# Patient Record
Sex: Female | Born: 1985
Health system: Southern US, Community
[De-identification: ages and names within clinical notes are randomized; demographics above are authoritative.]

## PROBLEM LIST (undated history)

## (undated) DIAGNOSIS — K219 Gastro-esophageal reflux disease without esophagitis: Secondary | ICD-10-CM

## (undated) DIAGNOSIS — F419 Anxiety disorder, unspecified: Secondary | ICD-10-CM

## (undated) DIAGNOSIS — T7840XA Allergy, unspecified, initial encounter: Secondary | ICD-10-CM

## (undated) DIAGNOSIS — Z9889 Other specified postprocedural states: Secondary | ICD-10-CM

## (undated) DIAGNOSIS — R112 Nausea with vomiting, unspecified: Secondary | ICD-10-CM

## (undated) HISTORY — DX: Anxiety disorder, unspecified: F41.9

## (undated) HISTORY — PX: ABDOMINAL SURGERY: SHX537

## (undated) HISTORY — DX: Allergy, unspecified, initial encounter: T78.40XA

## (undated) HISTORY — PX: WISDOM TOOTH EXTRACTION: SHX21

## (undated) HISTORY — PX: COSMETIC SURGERY: SHX468

## (undated) HISTORY — PX: ESOPHAGOSCOPY W/ BOTOX INJECTION: SHX1533

## (undated) HISTORY — DX: Gastro-esophageal reflux disease without esophagitis: K21.9

---

## 2017-06-18 LAB — HM HIV SCREENING LAB: HM HIV Screening: NEGATIVE

## 2017-06-18 LAB — HEPATITIS B SURFACE ANTIGEN: Hepatitis B Surface Ag: NEGATIVE

## 2017-06-18 LAB — HM HEPATITIS C SCREENING LAB: HM Hepatitis Screen: NEGATIVE

## 2018-02-18 LAB — HM PAP SMEAR: HM Pap smear: NEGATIVE

## 2019-03-30 DIAGNOSIS — F419 Anxiety disorder, unspecified: Secondary | ICD-10-CM | POA: Diagnosis not present

## 2019-03-30 DIAGNOSIS — F329 Major depressive disorder, single episode, unspecified: Secondary | ICD-10-CM | POA: Diagnosis not present

## 2019-04-07 DIAGNOSIS — F329 Major depressive disorder, single episode, unspecified: Secondary | ICD-10-CM | POA: Diagnosis not present

## 2019-04-07 DIAGNOSIS — F419 Anxiety disorder, unspecified: Secondary | ICD-10-CM | POA: Diagnosis not present

## 2019-04-22 DIAGNOSIS — F329 Major depressive disorder, single episode, unspecified: Secondary | ICD-10-CM | POA: Diagnosis not present

## 2019-04-22 DIAGNOSIS — F419 Anxiety disorder, unspecified: Secondary | ICD-10-CM | POA: Diagnosis not present

## 2019-05-05 DIAGNOSIS — F329 Major depressive disorder, single episode, unspecified: Secondary | ICD-10-CM | POA: Diagnosis not present

## 2019-05-05 DIAGNOSIS — F419 Anxiety disorder, unspecified: Secondary | ICD-10-CM | POA: Diagnosis not present

## 2019-05-05 MED FILL — VIIBRYD 20 MG TABLET: 20 | 90 days supply | Qty: 90 | Fill #0

## 2019-05-10 DIAGNOSIS — F329 Major depressive disorder, single episode, unspecified: Secondary | ICD-10-CM | POA: Diagnosis not present

## 2019-05-10 DIAGNOSIS — F419 Anxiety disorder, unspecified: Secondary | ICD-10-CM | POA: Diagnosis not present

## 2019-06-09 DIAGNOSIS — F329 Major depressive disorder, single episode, unspecified: Secondary | ICD-10-CM | POA: Diagnosis not present

## 2019-06-09 DIAGNOSIS — F419 Anxiety disorder, unspecified: Secondary | ICD-10-CM | POA: Diagnosis not present

## 2019-06-09 MED FILL — FLUoxetine HCL 20 MG CAPS: 20 | 30 days supply | Qty: 60 | Fill #0

## 2019-06-17 DIAGNOSIS — F329 Major depressive disorder, single episode, unspecified: Secondary | ICD-10-CM | POA: Diagnosis not present

## 2019-06-17 DIAGNOSIS — F419 Anxiety disorder, unspecified: Secondary | ICD-10-CM | POA: Diagnosis not present

## 2019-06-30 DIAGNOSIS — H5213 Myopia, bilateral: Secondary | ICD-10-CM | POA: Diagnosis not present

## 2019-06-30 DIAGNOSIS — H52223 Regular astigmatism, bilateral: Secondary | ICD-10-CM | POA: Diagnosis not present

## 2019-07-05 DIAGNOSIS — F329 Major depressive disorder, single episode, unspecified: Secondary | ICD-10-CM | POA: Diagnosis not present

## 2019-07-05 DIAGNOSIS — F419 Anxiety disorder, unspecified: Secondary | ICD-10-CM | POA: Diagnosis not present

## 2019-07-05 MED FILL — FLUoxetine HCL 40 MG CAPS: 40 | 90 days supply | Qty: 90 | Fill #0

## 2019-07-21 MED FILL — FLUoxetine HCL 40 MG CAPS: 40 | 90 days supply | Qty: 90 | Fill #0

## 2019-08-02 DIAGNOSIS — F329 Major depressive disorder, single episode, unspecified: Secondary | ICD-10-CM | POA: Diagnosis not present

## 2019-08-02 DIAGNOSIS — F419 Anxiety disorder, unspecified: Secondary | ICD-10-CM | POA: Diagnosis not present

## 2019-08-16 DIAGNOSIS — F419 Anxiety disorder, unspecified: Secondary | ICD-10-CM | POA: Diagnosis not present

## 2019-08-16 DIAGNOSIS — F329 Major depressive disorder, single episode, unspecified: Secondary | ICD-10-CM | POA: Diagnosis not present

## 2019-08-30 DIAGNOSIS — F329 Major depressive disorder, single episode, unspecified: Secondary | ICD-10-CM | POA: Diagnosis not present

## 2019-08-30 DIAGNOSIS — F419 Anxiety disorder, unspecified: Secondary | ICD-10-CM | POA: Diagnosis not present

## 2019-09-28 DIAGNOSIS — F329 Major depressive disorder, single episode, unspecified: Secondary | ICD-10-CM | POA: Diagnosis not present

## 2019-09-28 DIAGNOSIS — F419 Anxiety disorder, unspecified: Secondary | ICD-10-CM | POA: Diagnosis not present

## 2019-10-13 MED FILL — FLUoxetine HCL 40 MG CAPS: 40 | 90 days supply | Qty: 90 | Fill #0

## 2019-12-16 DIAGNOSIS — F329 Major depressive disorder, single episode, unspecified: Secondary | ICD-10-CM | POA: Diagnosis not present

## 2019-12-16 DIAGNOSIS — F419 Anxiety disorder, unspecified: Secondary | ICD-10-CM | POA: Diagnosis not present

## 2019-12-21 DIAGNOSIS — F419 Anxiety disorder, unspecified: Secondary | ICD-10-CM | POA: Diagnosis not present

## 2019-12-21 DIAGNOSIS — F329 Major depressive disorder, single episode, unspecified: Secondary | ICD-10-CM | POA: Diagnosis not present

## 2020-02-04 ENCOUNTER — Other Ambulatory Visit: Payer: Self-pay

## 2020-02-04 ENCOUNTER — Encounter: Payer: Self-pay | Admitting: Physician Assistant

## 2020-02-04 ENCOUNTER — Other Ambulatory Visit: Payer: Self-pay | Admitting: Physician Assistant

## 2020-02-04 ENCOUNTER — Ambulatory Visit (INDEPENDENT_AMBULATORY_CARE_PROVIDER_SITE_OTHER): Payer: 59 | Admitting: Physician Assistant

## 2020-02-04 VITALS — BP 142/88 | HR 99 | Temp 98.2°F | Ht 66.0 in | Wt 188.6 lb

## 2020-02-04 DIAGNOSIS — E669 Obesity, unspecified: Secondary | ICD-10-CM

## 2020-02-04 DIAGNOSIS — R030 Elevated blood-pressure reading, without diagnosis of hypertension: Secondary | ICD-10-CM | POA: Diagnosis not present

## 2020-02-04 DIAGNOSIS — K219 Gastro-esophageal reflux disease without esophagitis: Secondary | ICD-10-CM | POA: Diagnosis not present

## 2020-02-04 DIAGNOSIS — Z136 Encounter for screening for cardiovascular disorders: Secondary | ICD-10-CM | POA: Diagnosis not present

## 2020-02-04 DIAGNOSIS — F419 Anxiety disorder, unspecified: Secondary | ICD-10-CM

## 2020-02-04 DIAGNOSIS — Z13220 Encounter for screening for lipoid disorders: Secondary | ICD-10-CM

## 2020-02-04 DIAGNOSIS — Z0000 Encounter for general adult medical examination without abnormal findings: Secondary | ICD-10-CM

## 2020-02-04 LAB — COMPREHENSIVE METABOLIC PANEL
ALT: 20 U/L (ref 0–35)
AST: 16 U/L (ref 0–37)
Albumin: 4.6 g/dL (ref 3.5–5.2)
Alkaline Phosphatase: 54 U/L (ref 39–117)
BUN: 11 mg/dL (ref 6–23)
CO2: 24 mEq/L (ref 19–32)
Calcium: 9.8 mg/dL (ref 8.4–10.5)
Chloride: 100 mEq/L (ref 96–112)
Creatinine, Ser: 0.79 mg/dL (ref 0.40–1.20)
GFR: 82.56 mL/min (ref >60.00)
Glucose, Bld: 88 mg/dL (ref 70–99)
Potassium: 4.7 mEq/L (ref 3.5–5.1)
Sodium: 136 mEq/L (ref 135–145)
Total Bilirubin: 0.4 mg/dL (ref 0.2–1.2)
Total Protein: 7.4 g/dL (ref 6.0–8.3)

## 2020-02-04 LAB — CBC WITH DIFFERENTIAL/PLATELET
Basophils Absolute: 0 10*3/uL (ref 0.0–0.1)
Basophils Relative: 0.5 % (ref 0.0–3.0)
Eosinophils Absolute: 0.1 10*3/uL (ref 0.0–0.7)
Eosinophils Relative: 1.4 % (ref 0.0–5.0)
HCT: 39.1 % (ref 36.0–46.0)
Hemoglobin: 12.9 g/dL (ref 12.0–15.0)
Lymphocytes Relative: 23.8 % (ref 12.0–46.0)
Lymphs Abs: 1.9 10*3/uL (ref 0.7–4.0)
MCHC: 33 g/dL (ref 30.0–36.0)
MCV: 87.5 fl (ref 78.0–100.0)
Monocytes Absolute: 0.4 10*3/uL (ref 0.1–1.0)
Monocytes Relative: 5.2 % (ref 3.0–12.0)
Neutro Abs: 5.6 10*3/uL (ref 1.4–7.7)
Neutrophils Relative %: 69.1 % (ref 43.0–77.0)
Platelets: 290 10*3/uL (ref 150.0–400.0)
RBC: 4.47 Mil/uL (ref 3.87–5.11)
RDW: 13.7 % (ref 11.5–15.5)
WBC: 8.2 10*3/uL (ref 4.0–10.5)

## 2020-02-04 LAB — LIPID PANEL
Cholesterol: 226 mg/dL — ABNORMAL HIGH (ref 0–200)
HDL: 58 mg/dL (ref >39.00)
NonHDL: 168.43
Total CHOL/HDL Ratio: 4
Triglycerides: 213 mg/dL — ABNORMAL HIGH (ref 0.0–149.0)
VLDL: 42.6 mg/dL — ABNORMAL HIGH (ref 0.0–40.0)

## 2020-02-04 LAB — LDL CHOLESTEROL, DIRECT: Direct LDL: 149 mg/dL

## 2020-02-04 MED ORDER — NORETHIN ACE-ETH ESTRAD-FE 1-20 MG-MCG PO TABS: 1.0000 | ORAL_TABLET | Freq: Every day | ORAL | 3 refills | Status: DC

## 2020-02-04 MED ORDER — OMEPRAZOLE 20 MG PO CPDR: 20.0000 mg | DELAYED_RELEASE_CAPSULE | Freq: Every day | ORAL | 1 refills | Status: DC

## 2020-02-04 MED ORDER — FLUOXETINE HCL 40 MG PO CAPS: 40.0000 mg | ORAL_CAPSULE | Freq: Every day | ORAL | 1 refills | Status: DC

## 2020-02-04 MED FILL — OMEPRAZOLE 20 MG CAP: 20 | 90 days supply | Qty: 90 | Fill #0

## 2020-02-04 NOTE — Progress Notes (Signed)
Subjective:    Jamie Holder is a 34 y.o. female and is here for a comprehensive physical exam.  HPI  Health Maintenance Due  Topic Date Due  . Hepatitis C Screening  Never done  . HIV Screening  Never done    Acute Concerns: None  Chronic Issues: Elevated blood pressure reading  -- Currently not on any medication. At home blood pressure readings are: not checked. She did have to monitor her blood pressure during her pregnancy but was never started on medication. Patient denies chest pain, SOB, blurred vision, dizziness, unusual headaches, lower leg swelling. Patient is compliant with medication. Denies excessive caffeine intake, stimulant usage, excessive alcohol intake, or increase in salt consumption.  BP Readings from Last 3 Encounters:  02/04/20 (!) 142/88   Anxiety -- long history of this since childhood. Started on Lexapro, felt good on this, but had issues with sexual dysfunction. Currently on Prozac 40 mg daily and doing well with this medication. Continues to see therapist q 6-8 weeks. Denies PPD or current issues with depression. GERD -- currently taking omeprazole 20 mg daily. Symptoms are overall well controlled when she takes this medication. Denies rectal bleeding, unintentional weight loss.  Health Maintenance: Immunizations -- UTD Colonoscopy -- n/a Mammogram -- n/a PAP -- UTD per patient -- requesting records Bone Density -- n/a Diet -- well balanced Caffeine intake -- 1-2 cups daily Sleep habits -- variable Exercise -- exercises 3 times a week -- does Barre classes Current Weight -- Weight: 188 lb 9.6 oz (85.5 kg) -- high for her Weight History: Wt Readings from Last 10 Encounters:  02/04/20 188 lb 9.6 oz (85.5 kg)   Body mass index is 30.44 kg/m. Mood -- anxiety overall controlled No LMP recorded. (Menstrual status: Oral contraceptives). Period characteristics -- n/a Birth control -- well controlled Alcohol -- 8 glasses per week  Depression  screen PHQ 2/9 02/04/2020  Decreased Interest 0  Down, Depressed, Hopeless 0  PHQ - 2 Score 0     Other providers/specialists: Patient Care Team: Jarold Motto, [place] as PCP - General (Physician Assistant) Rosemarie Beath, NP as Nurse Practitioner (Psychology) Melida Quitter, OD as Attending Physician (Optometry)   UTD with dentist  PMHx, SurgHx, SocialHx, Medications, and Allergies were reviewed in the Visit Navigator and updated as appropriate.   Past Medical History:  Diagnosis Date  . Anxiety     History reviewed. No pertinent surgical history.   Family History  Problem Relation Age of Onset  . Depression Mother   . Hearing loss Mother   . Hyperlipidemia Mother   . Hypertension Mother   . Hyperlipidemia Father   . Hypertension Father   . Depression Sister   . Heart attack Maternal Grandmother   . Heart attack Paternal Grandmother   . Breast cancer Neg Hx   . Colon cancer Neg Hx   . Osteoporosis Neg Hx     Social History   Tobacco Use  . Smoking status: Never Smoker  . Smokeless tobacco: Never Used  Substance Use Topics  . Alcohol use: Yes    Alcohol/week: 5.0 standard drinks    Types: 5 Glasses of wine per week  . Drug use: Never    Review of Systems:   Review of Systems  Constitutional: Negative for chills, fever, malaise/fatigue and weight loss.  HENT: Negative for hearing loss, sinus pain and sore throat.   Respiratory: Negative for cough and hemoptysis.   Cardiovascular: Negative for chest pain, palpitations, leg swelling  and PND.  Gastrointestinal: Negative for abdominal pain, constipation, diarrhea, heartburn, nausea and vomiting.  Genitourinary: Negative for dysuria, frequency and urgency.  Musculoskeletal: Negative for back pain, myalgias and neck pain.  Skin: Negative for itching and rash.  Neurological: Negative for dizziness, tingling, seizures and headaches.  Endo/Heme/Allergies: Negative for polydipsia.  Psychiatric/Behavioral:  Negative for depression. The patient is not nervous/anxious.     Objective:   BP (!) 142/88   Pulse 99   Temp 98.2 F (36.8 C) (Temporal)   Ht 5\' 6"  (1.676 m)   Wt 188 lb 9.6 oz (85.5 kg)   SpO2 99%   BMI 30.44 kg/m   General Appearance:    Alert, cooperative, no distress, appears stated age  Head:    Normocephalic, without obvious abnormality, atraumatic  Eyes:    PERRL, conjunctiva/corneas clear, EOM's intact, fundi    benign, both eyes  Ears:    Normal TM's and external ear canals, both ears  Nose:   Nares normal, septum midline, mucosa normal, no drainage    or sinus tenderness  Throat:   Lips, mucosa, and tongue normal; teeth and gums normal  Neck:   Supple, symmetrical, trachea midline, no adenopathy;    thyroid:  no enlargement/tenderness/nodules; no carotid   bruit or JVD  Back:     Symmetric, no curvature, ROM normal, no CVA tenderness  Lungs:     Clear to auscultation bilaterally, respirations unlabored  Chest Wall:    No tenderness or deformity   Heart:    Regular rate and rhythm, S1 and S2 normal, no murmur, rub   or gallop  Breast Exam:    Deferred  Abdomen:     Soft, non-tender, bowel sounds active all four quadrants,    no masses, no organomegaly  Genitalia:    Deferred  Rectal:    Deferred  Extremities:   Extremities normal, atraumatic, no cyanosis or edema  Pulses:   2+ and symmetric all extremities  Skin:   Skin color, texture, turgor normal, no rashes or lesions  Lymph nodes:   Cervical, supraclavicular, and axillary nodes normal  Neurologic:   CNII-XII intact, normal strength, sensation and reflexes    throughout   No results found for this or any previous visit.  Assessment/Plan:   Ron was seen today for new patient (initial visit).  Diagnoses and all orders for this visit:  Routine physical examination Today patient counseled on age appropriate routine health concerns for screening and prevention, each reviewed and up to date or declined.  Immunizations reviewed and up to date or declined. Labs ordered and reviewed. Risk factors for depression reviewed and negative. Hearing function and visual acuity are intact. ADLs screened and addressed as needed. Functional ability and level of safety reviewed and appropriate. Education, counseling and referrals performed based on assessed risks today. Patient provided with a copy of personalized plan for preventive services.  Encounter for lipid screening for cardiovascular disease -     Lipid panel  Anxiety Overall well controlled. Continue Prozac 40 mg daily. Follow-up in 6 months. -     CBC with Differential/Platelet -     Comprehensive metabolic panel  Obesity, unspecified classification, unspecified obesity type, unspecified whether serious comorbidity present Continue exercise and better eating. Encouraged reduction of alcohol.   GERD Controlled with prilosec 20 mg daily. Encouraged weight loss and alcohol reduction.  Elevated blood pressure reading Improved at re-check. Asymptomatic. Follow-up in 6 months, sooner if concerns. BP log given with parameters to schedule.  I recommend that she check her blood pressure regularly and follow-up if remains elevated >140/90.  Other orders -     FLUoxetine (PROZAC) 40 MG capsule; Take 1 capsule (40 mg total) by mouth daily. -     norethindrone-ethinyl estradiol (LOESTRIN FE) 1-20 MG-MCG tablet; Take 1 tablet by mouth daily. -     omeprazole (PRILOSEC) 20 MG capsule; Take 1 capsule (20 mg total) by mouth daily.   Well Adult Exam: Labs ordered: Yes. Patient counseling was done. See below for items discussed. Discussed the patient's BMI. The BMI is not in the acceptable range; BMI management plan is completed Follow up in 6 months.  Patient Counseling:   [x]     Nutrition: Stressed importance of moderation in sodium/caffeine intake, saturated fat and cholesterol, caloric balance, sufficient intake of fresh fruits, vegetables, fiber, calcium,  iron, and 1 mg of folate supplement per day (for females capable of pregnancy).   [x]      Stressed the importance of regular exercise.    [x]     Substance Abuse: Discussed cessation/primary prevention of tobacco, alcohol, or other drug use; driving or other dangerous activities under the influence; availability of treatment for abuse.    [x]      Injury prevention: Discussed safety belts, safety helmets, smoke detector, smoking near bedding or upholstery.    [x]      Sexuality: Discussed sexually transmitted diseases, partner selection, use of condoms, avoidance of unintended pregnancy  and contraceptive alternatives.    [x]     Dental health: Discussed importance of regular tooth brushing, flossing, and dental visits.   [x]      Health maintenance and immunizations reviewed. Please refer to Health maintenance section.   CMA or LPN served as scribe during this visit. History, Physical, and Plan performed by medical provider. The above documentation has been reviewed and is accurate and complete.  , PA-C [facility]

## 2020-02-04 NOTE — Patient Instructions (Signed)
It was great to see you!  Please go to the lab for blood work.   Our office will call you with your results unless you have chosen to receive results via MyChart.  If your blood work is normal we will follow-up each year for physicals and as scheduled for chronic medical problems.  If anything is abnormal we will treat accordingly and get you in for a follow-up.  Take care,  Ravi Tuccillo    Health Maintenance, Female Adopting a healthy lifestyle and getting preventive care are important in promoting health and wellness. Ask your health care provider about:  The right schedule for you to have regular tests and exams.  Things you can do on your own to prevent diseases and keep yourself healthy. What should I know about diet, weight, and exercise? Eat a healthy diet   Eat a diet that includes plenty of vegetables, fruits, low-fat dairy products, and lean protein.  Do not eat a lot of foods that are high in solid fats, added sugars, or sodium. Maintain a healthy weight Body mass index (BMI) is used to identify weight problems. It estimates body fat based on height and weight. Your health care provider can help determine your BMI and help you achieve or maintain a healthy weight. Get regular exercise Get regular exercise. This is one of the most important things you can do for your health. Most adults should:  Exercise for at least 150 minutes each week. The exercise should increase your heart rate and make you sweat (moderate-intensity exercise).  Do strengthening exercises at least twice a week. This is in addition to the moderate-intensity exercise.  Spend less time sitting. Even light physical activity can be beneficial. Watch cholesterol and blood lipids Have your blood tested for lipids and cholesterol at 36 years of age, then have this test every 5 years. Have your cholesterol levels checked more often if:  Your lipid or cholesterol levels are high.  You are older than 36  years of age.  You are at high risk for heart disease. What should I know about cancer screening? Depending on your health history and family history, you may need to have cancer screening at various ages. This may include screening for:  Breast cancer.  Cervical cancer.  Colorectal cancer.  Skin cancer.  Lung cancer. What should I know about heart disease, diabetes, and high blood pressure? Blood pressure and heart disease  High blood pressure causes heart disease and increases the risk of stroke. This is more likely to develop in people who have high blood pressure readings, are of African descent, or are overweight.  Have your blood pressure checked: ? Every 3-5 years if you are 18-39 years of age. ? Every year if you are 40 years old or older. Diabetes Have regular diabetes screenings. This checks your fasting blood sugar level. Have the screening done:  Once every three years after age 40 if you are at a normal weight and have a low risk for diabetes.  More often and at a younger age if you are overweight or have a high risk for diabetes. What should I know about preventing infection? Hepatitis B If you have a higher risk for hepatitis B, you should be screened for this virus. Talk with your health care provider to find out if you are at risk for hepatitis B infection. Hepatitis C Testing is recommended for:  Everyone born from 1945 through 1965.  Anyone with known risk factors for hepatitis C. Sexually   transmitted infections (STIs)  Get screened for STIs, including gonorrhea and chlamydia, if: ? You are sexually active and are younger than 36 years of age. ? You are older than 36 years of age and your health care provider tells you that you are at risk for this type of infection. ? Your sexual activity has changed since you were last screened, and you are at increased risk for chlamydia or gonorrhea. Ask your health care provider if you are at risk.  Ask your health  care provider about whether you are at high risk for HIV. Your health care provider may recommend a prescription medicine to help prevent HIV infection. If you choose to take medicine to prevent HIV, you should first get tested for HIV. You should then be tested every 3 months for as long as you are taking the medicine. Pregnancy  If you are about to stop having your period (premenopausal) and you may become pregnant, seek counseling before you get pregnant.  Take 400 to 800 micrograms (mcg) of folic acid every day if you become pregnant.  Ask for birth control (contraception) if you want to prevent pregnancy. Osteoporosis and menopause Osteoporosis is a disease in which the bones lose minerals and strength with aging. This can result in bone fractures. If you are 65 years old or older, or if you are at risk for osteoporosis and fractures, ask your health care provider if you should:  Be screened for bone loss.  Take a calcium or vitamin D supplement to lower your risk of fractures.  Be given hormone replacement therapy (HRT) to treat symptoms of menopause. Follow these instructions at home: Lifestyle  Do not use any products that contain nicotine or tobacco, such as cigarettes, e-cigarettes, and chewing tobacco. If you need help quitting, ask your health care provider.  Do not use street drugs.  Do not share needles.  Ask your health care provider for help if you need support or information about quitting drugs. Alcohol use  Do not drink alcohol if: ? Your health care provider tells you not to drink. ? You are pregnant, may be pregnant, or are planning to become pregnant.  If you drink alcohol: ? Limit how much you use to 0-1 drink a day. ? Limit intake if you are breastfeeding.  Be aware of how much alcohol is in your drink. In the [place] one drink equals one 12 oz bottle of beer (355 mL), one 5 oz glass of wine (148 mL), or one 1 oz glass of hard liquor (44 mL). General  instructions  Schedule regular health, dental, and eye exams.  Stay current with your vaccines.  Tell your health care provider if: ? You often feel depressed. ? You have ever been abused or do not feel safe at home. Summary  Adopting a healthy lifestyle and getting preventive care are important in promoting health and wellness.  Follow your health care provider's instructions about healthy diet, exercising, and getting tested or screened for diseases.  Follow your health care provider's instructions on monitoring your cholesterol and blood pressure. This information is not intended to replace advice given to you by your health care provider. Make sure you discuss any questions you have with your health care provider. Document Revised: 07/08/2018 Document Reviewed: 07/08/2018 Elsevier Patient Education  2020 [employer]  

## 2020-02-07 ENCOUNTER — Encounter: Payer: Self-pay | Admitting: Physician Assistant

## 2020-02-07 LAB — ABO AND RH: ABO Rh Results: A POS

## 2020-02-07 LAB — RUBELLA IGG AB(REFL): RUBELLA AB (IGG) (REFL): 16

## 2020-02-07 LAB — RPR: RPR Screen: NONREACTIVE

## 2020-02-07 MED FILL — BLISOVI FE 1/20 1-20 MG-MCG: 1-20 | 84 days supply | Qty: 84 | Fill #0

## 2020-02-14 DIAGNOSIS — F329 Major depressive disorder, single episode, unspecified: Secondary | ICD-10-CM | POA: Diagnosis not present

## 2020-02-14 DIAGNOSIS — F419 Anxiety disorder, unspecified: Secondary | ICD-10-CM | POA: Diagnosis not present

## 2020-04-17 MED FILL — FLUoxetine HCL 40 MG CAPS: 40 | 90 days supply | Qty: 90 | Fill #0

## 2020-05-08 MED FILL — OMEPRAZOLE 20 MG CAP: 20 | 90 days supply | Qty: 90 | Fill #1

## 2020-05-11 MED FILL — BLISOVI FE 1/20 1-20 MG-MCG: 1-20 | 84 days supply | Qty: 84 | Fill #1

## 2020-07-10 MED FILL — FLUoxetine HCL 40 MG CAPS: 40 | 90 days supply | Qty: 90 | Fill #1

## 2020-07-23 MED FILL — BLISOVI FE 1/20 1-20 MG-MCG: 1-20 | 84 days supply | Qty: 84 | Fill #2

## 2020-07-31 DIAGNOSIS — Z03818 Encounter for observation for suspected exposure to other biological agents ruled out: Secondary | ICD-10-CM | POA: Diagnosis not present

## 2020-07-31 DIAGNOSIS — Z20822 Contact with and (suspected) exposure to covid-19: Secondary | ICD-10-CM | POA: Diagnosis not present

## 2020-08-07 ENCOUNTER — Ambulatory Visit: Payer: 59 | Admitting: Physician Assistant

## 2020-08-10 ENCOUNTER — Other Ambulatory Visit: Payer: Self-pay

## 2020-08-11 ENCOUNTER — Other Ambulatory Visit: Payer: Self-pay | Admitting: Physician Assistant

## 2020-08-11 ENCOUNTER — Encounter: Payer: Self-pay | Admitting: Physician Assistant

## 2020-08-11 ENCOUNTER — Ambulatory Visit: Payer: 59 | Admitting: Physician Assistant

## 2020-08-11 VITALS — BP 160/100 | HR 95 | Temp 97.9°F | Ht 66.0 in | Wt 191.5 lb

## 2020-08-11 DIAGNOSIS — Z124 Encounter for screening for malignant neoplasm of cervix: Secondary | ICD-10-CM | POA: Diagnosis not present

## 2020-08-11 DIAGNOSIS — E669 Obesity, unspecified: Secondary | ICD-10-CM | POA: Diagnosis not present

## 2020-08-11 DIAGNOSIS — R030 Elevated blood-pressure reading, without diagnosis of hypertension: Secondary | ICD-10-CM

## 2020-08-11 DIAGNOSIS — F419 Anxiety disorder, unspecified: Secondary | ICD-10-CM

## 2020-08-11 MED ORDER — FLUOXETINE HCL 40 MG PO CAPS: 40.0000 mg | ORAL_CAPSULE | Freq: Every day | ORAL | 3 refills | Status: DC

## 2020-08-11 NOTE — Patient Instructions (Signed)
It was great to see you!  1. Exercise (walking counts!!) for 30 minutes -- 3 times a week 2. Try to come up with a meal plan for your family. Keep it simple! 3. 64 oz of water daily, then may add beverages of choices  Choose healthy foods 80% of the time.  You will be contacted about your gynecology appointment, let me know if you'd like to switch to a progesterone-only pill in the meantime.  Take care,  Jarold Motto PA-C

## 2020-08-11 NOTE — Progress Notes (Signed)
Jamie Holder is a 35 y.o. female is here to discuss:  I acted as a Neurosurgeon for [employer], PA-C Corky Mull, LPN   History of Present Illness:   Chief Complaint  Patient presents with  . Anxiety  . f/u elevated blood pressure    HPI   Anxiety Pt is currently taking prozac 40 mg daily. Tolerating medication well. Denies SI/HI.  Elevated blood pressure Pt following up on blood pressure has been checking at home and averaging 140/90. Pt denies headaches, dizziness, blurred vision, chest pain, SOB or lower leg edema. Denies excessive caffeine intake, stimulant usage, excessive alcohol intake or increase in salt consumption.   Obesity Working on trying to reduce her weight. Has reduced alcohol intake. Has tried to eat a more balanced breakfast. Variable physical activity -- no scheduled exercise since christmas due to busy schedule. Eats a mixture of food out and at home. No excessive sugary beverages. Has never been on medication for weight loss.    Wt Readings from Last 4 Encounters:  08/11/20 191 lb 8 oz (86.9 kg)  02/04/20 188 lb 9.6 oz (85.5 kg)     There are no preventive care reminders to display for this patient.  Past Medical History:  Diagnosis Date  . Anxiety      Social History   Tobacco Use  . Smoking status: Never Smoker  . Smokeless tobacco: Never Used  Substance Use Topics  . Alcohol use: Yes    Alcohol/week: 5.0 standard drinks    Types: 5 Glasses of wine per week  . Drug use: Never    History reviewed. No pertinent surgical history.  Family History  Problem Relation Age of Onset  . Depression Mother   . Hearing loss Mother   . Hyperlipidemia Mother   . Hypertension Mother   . Hyperlipidemia Father   . Hypertension Father   . Depression Sister   . Heart attack Maternal Grandmother   . Heart attack Paternal Grandmother   . Breast cancer Neg Hx   . Colon cancer Neg Hx   . Osteoporosis Neg Hx     PMHx, SurgHx, SocialHx, FamHx,  Medications, and Allergies were reviewed in the Visit Navigator and updated as appropriate.   Patient Active Problem List   Diagnosis Date Noted  . Elevated blood pressure reading 02/04/2020  . Gastroesophageal reflux disease 02/04/2020  . Obesity 02/04/2020  . Anxiety     Social History   Tobacco Use  . Smoking status: Never Smoker  . Smokeless tobacco: Never Used  Substance Use Topics  . Alcohol use: Yes    Alcohol/week: 5.0 standard drinks    Types: 5 Glasses of wine per week  . Drug use: Never    Current Medications and Allergies:    Current Outpatient Medications:  .  cetirizine (ZYRTEC) 10 MG chewable tablet, Chew 10 mg by mouth daily., Disp: , Rfl:  .  norethindrone-ethinyl estradiol (LOESTRIN FE) 1-20 MG-MCG tablet, Take 1 tablet by mouth daily., Disp: 84 tablet, Rfl: 3 .  omeprazole (PRILOSEC) 20 MG capsule, Take 1 capsule (20 mg total) by mouth daily., Disp: 90 capsule, Rfl: 1 .  FLUoxetine (PROZAC) 40 MG capsule, Take 1 capsule (40 mg total) by mouth daily., Disp: 90 capsule, Rfl: 3  No Known Allergies  Review of Systems   ROS  Negative unless otherwise specified per HPI.  Vitals:   Vitals:   08/11/20 0945  BP: (!) 160/100  Pulse: 95  Temp: 97.9 F (36.6 C)  TempSrc: Temporal  SpO2: 98%  Weight: 191 lb 8 oz (86.9 kg)  Height: 5\' 6"  (1.676 m)     Body mass index is 30.91 kg/m.   Physical Exam:    Physical Exam Vitals and nursing note reviewed.  Constitutional:      General: She is not in acute distress.    Appearance: She is well-developed. She is not ill-appearing, toxic-appearing or sickly-appearing.  Cardiovascular:     Rate and Rhythm: Normal rate and regular rhythm.     Pulses: Normal pulses.     Heart sounds: Normal heart sounds, S1 normal and S2 normal.     Comments: No LE edema Pulmonary:     Effort: Pulmonary effort is normal.     Breath sounds: Normal breath sounds.  Skin:    General: Skin is warm, dry and intact.   Neurological:     Mental Status: She is alert.     GCS: GCS eye subscore is 4. GCS verbal subscore is 5. GCS motor subscore is 6.  Psychiatric:        Mood and Affect: Mood and affect normal.        Speech: Speech normal.        Behavior: Behavior normal. Behavior is cooperative.      Assessment and Plan:    Jamie Holder was seen today for anxiety and f/u elevated blood pressure.  Diagnoses and all orders for this visit:  Anxiety Well controlled. Continue prozac 40 mg daily. Follow-up in 1 year, sooner if concerns.  Elevated blood pressure reading Above goal today. Home blood pressures are closer to goal. Discussed possibility of switching from COCs to POPs.  She would like to discuss with gynecology. Needs an updated PAP soon. Referral placed.  Obesity, unspecified classification, unspecified obesity type, unspecified whether serious comorbidity present Nutrition handouts reviewed with her. Follow-up as needed, declines medications at this time.  Pap smear for cervical cancer screening -     Ambulatory referral to Gynecology  Other orders -     FLUoxetine (PROZAC) 40 MG capsule; Take 1 capsule (40 mg total) by mouth daily.  CMA or LPN served as scribe during this visit. History, Physical, and Plan performed by medical provider. The above documentation has been reviewed and is accurate and complete.   [name], PA-C [facility] 08/11/2020  Follow-up: No follow-ups on file.

## 2020-08-17 ENCOUNTER — Other Ambulatory Visit: Payer: Self-pay | Admitting: Physician Assistant

## 2020-08-17 MED FILL — OMEPRAZOLE 20 MG CAP: 20 | 90 days supply | Qty: 90 | Fill #0

## 2020-09-04 DIAGNOSIS — Z1159 Encounter for screening for other viral diseases: Secondary | ICD-10-CM | POA: Diagnosis not present

## 2020-09-05 DIAGNOSIS — R142 Eructation: Secondary | ICD-10-CM | POA: Diagnosis not present

## 2020-09-05 DIAGNOSIS — K224 Dyskinesia of esophagus: Secondary | ICD-10-CM | POA: Diagnosis not present

## 2020-09-05 DIAGNOSIS — R140 Abdominal distension (gaseous): Secondary | ICD-10-CM | POA: Diagnosis not present

## 2020-09-06 DIAGNOSIS — R142 Eructation: Secondary | ICD-10-CM | POA: Diagnosis not present

## 2020-09-06 DIAGNOSIS — G809 Cerebral palsy, unspecified: Secondary | ICD-10-CM | POA: Diagnosis not present

## 2020-09-06 DIAGNOSIS — K2289 Other specified disease of esophagus: Secondary | ICD-10-CM | POA: Diagnosis not present

## 2020-09-06 DIAGNOSIS — M629 Disorder of muscle, unspecified: Secondary | ICD-10-CM | POA: Diagnosis not present

## 2020-09-06 DIAGNOSIS — R140 Abdominal distension (gaseous): Secondary | ICD-10-CM | POA: Diagnosis not present

## 2020-09-06 DIAGNOSIS — K224 Dyskinesia of esophagus: Secondary | ICD-10-CM | POA: Diagnosis not present

## 2020-09-14 DIAGNOSIS — K224 Dyskinesia of esophagus: Secondary | ICD-10-CM | POA: Diagnosis not present

## 2020-09-14 DIAGNOSIS — R142 Eructation: Secondary | ICD-10-CM | POA: Diagnosis not present

## 2020-09-14 DIAGNOSIS — R140 Abdominal distension (gaseous): Secondary | ICD-10-CM | POA: Diagnosis not present

## 2020-09-22 ENCOUNTER — Other Ambulatory Visit: Payer: Self-pay

## 2020-09-22 ENCOUNTER — Other Ambulatory Visit: Payer: Self-pay | Admitting: Obstetrics and Gynecology

## 2020-09-22 ENCOUNTER — Encounter: Payer: Self-pay | Admitting: Obstetrics and Gynecology

## 2020-09-22 ENCOUNTER — Ambulatory Visit (INDEPENDENT_AMBULATORY_CARE_PROVIDER_SITE_OTHER): Payer: 59 | Admitting: Obstetrics and Gynecology

## 2020-09-22 ENCOUNTER — Other Ambulatory Visit (HOSPITAL_COMMUNITY)
Admission: RE | Admit: 2020-09-22 | Discharge: 2020-09-22 | Disposition: A | Discharge status: Home / Self Care | Payer: 59 | Source: Ambulatory Visit | Attending: Obstetrics and Gynecology | Admitting: Obstetrics and Gynecology

## 2020-09-22 VITALS — BP 140/72 | HR 94 | Ht 66.0 in | Wt 187.0 lb

## 2020-09-22 DIAGNOSIS — Z01419 Encounter for gynecological examination (general) (routine) without abnormal findings: Secondary | ICD-10-CM | POA: Diagnosis not present

## 2020-09-22 DIAGNOSIS — Z124 Encounter for screening for malignant neoplasm of cervix: Secondary | ICD-10-CM | POA: Insufficient documentation

## 2020-09-22 DIAGNOSIS — R030 Elevated blood-pressure reading, without diagnosis of hypertension: Secondary | ICD-10-CM

## 2020-09-22 DIAGNOSIS — Z3009 Encounter for other general counseling and advice on contraception: Secondary | ICD-10-CM | POA: Diagnosis not present

## 2020-09-22 MED ORDER — NORETHINDRONE 0.35 MG PO TABS: 1.0000 | ORAL_TABLET | Freq: Every day | ORAL | 3 refills | Status: DC

## 2020-09-22 MED FILL — NORETHINDRONE 0.35 MG TABS: 0.35 | 84 days supply | Qty: 84 | Fill #0

## 2020-09-22 NOTE — Progress Notes (Signed)
35 y.o. G15P2002 Married White or Caucasian Not Hispanic or Latino female here for annual exam.   She is on continuous OCP's, no bleeding.  Prior to OCP's, cycles were normal with moderate cramps.  Sexually active, no pain. No plans for more children.   She has had some elevated BP readings in the last year. In 7/21 142/88, in 1/22 160/100    No LMP recorded. (Menstrual status: Oral contraceptives).          Sexually active: Yes.    The current method of family planning is Loestrin FE.    Exercising: Yes.    once weekly Smoker:  no  Health Maintenance: Pap:  About 3-4 years ago History of abnormal Pap:  no TDaP:  10/29/17 Gardasil: never, discussed, declines for now.    reports that she has never smoked. She has never used smokeless tobacco. She reports current alcohol use of about 3.0 standard drinks of alcohol per week. She reports that she does not use drugs. 46 year old daughter, 39 year old son. She is a Futures trader.   Past Medical History:  Diagnosis Date  . Anxiety   Anxiety is under control  Past Surgical History:  Procedure Laterality Date  . ESOPHAGOSCOPY W/ BOTOX INJECTION    . WISDOM TOOTH EXTRACTION      Current Outpatient Medications  Medication Sig Dispense Refill  . cetirizine (ZYRTEC) 10 MG chewable tablet Chew 10 mg by mouth daily.    Marland Kitchen FLUoxetine (PROZAC) 40 MG capsule Take 1 capsule (40 mg total) by mouth daily. 90 capsule 3  . norethindrone-ethinyl estradiol (LOESTRIN FE) 1-20 MG-MCG tablet Take 1 tablet by mouth daily. 84 tablet 3  . omeprazole (PRILOSEC) 20 MG capsule TAKE 1 CAPSULE BY MOUTH DAILY 90 capsule 1   No current facility-administered medications for this visit.    Family History  Problem Relation Age of Onset  . Depression Mother   . Hearing loss Mother   . Hyperlipidemia Mother   . Hypertension Mother   . Thyroid cancer Mother   . Hyperlipidemia Father   . Hypertension Father   . Depression Sister   . Heart attack Maternal Grandmother    . Heart attack Paternal Grandmother   . Breast cancer Neg Hx   . Colon cancer Neg Hx   . Osteoporosis Neg Hx     Review of Systems  Constitutional: Negative.   HENT: Negative.   Eyes: Negative.   Respiratory: Negative.   Cardiovascular: Negative.   Gastrointestinal: Negative.   Endocrine: Negative.   Genitourinary: Negative.   Musculoskeletal: Negative.   Skin: Negative.   Allergic/Immunologic: Negative.   Neurological: Negative.   Hematological: Negative.   Psychiatric/Behavioral: Negative.     Exam:   BP (!) 150/84   Pulse 94   Ht 5\' 6"  (1.676 m)   Wt 187 lb (84.8 kg)   SpO2 99%   BMI 30.18 kg/m   Weight change: @WEIGHTCHANGE @ Height:   Height: 5\' 6"  (167.6 cm)  Ht Readings from Last 3 Encounters:  09/22/20 5\' 6"  (1.676 m)  08/11/20 5\' 6"  (1.676 m)  02/04/20 5\' 6"  (1.676 m)    General appearance: alert, cooperative and appears stated age Head: Normocephalic, without obvious abnormality, atraumatic Neck: no adenopathy, supple, symmetrical, trachea midline and thyroid normal to inspection and palpation Lungs: clear to auscultation bilaterally Cardiovascular: regular rate and rhythm Breasts: normal appearance, no masses or tenderness Abdomen: soft, non-tender; non distended,  no masses,  no organomegaly Extremities: extremities normal, atraumatic,  no cyanosis or edema Skin: Skin color, texture, turgor normal. No rashes or lesions Lymph nodes: Cervical, supraclavicular, and axillary nodes normal. No abnormal inguinal nodes palpated Neurologic: Grossly normal   Pelvic: External genitalia:  no lesions              Urethra:  normal appearing urethra with no masses, tenderness or lesions              Bartholins and Skenes: normal                 Vagina: normal appearing vagina with normal color and discharge, no lesions              Cervix: no lesions               Bimanual Exam:  Uterus:  no masses or tenderness              Adnexa: no mass, fullness,  tenderness               Rectovaginal: Confirms               Anus:  normal sphincter tone, no lesions  Zenovia Jordan chaperoned for the exam.  1. Well woman exam Discussed breast self exam Discussed calcium and vit D intake Labs with primary  2. General counseling and advice on female contraception Discussed given her multiple elevated BP readings that she should come off of combination OCP's. Discussed other options. She would like to try micronor, information also given on the mirena IUD. - norethindrone (MICRONOR) 0.35 MG tablet; Take 1 tablet (0.35 mg total) by mouth daily.  Dispense: 84 tablet; Refill: 3  3. Screening for cervical cancer - Cytology - PAP with HPV  4. Elevated blood pressure reading Repeat BP was 140/72 She will f/u with her primary

## 2020-09-22 NOTE — Patient Instructions (Addendum)
Contraception Choices Contraception, also called birth control, refers to methods or devices that prevent pregnancy. Hormonal methods Contraceptive implant A contraceptive implant is a thin, plastic tube that contains a hormone that prevents pregnancy. It is different from an intrauterine device (IUD). It is inserted into the upper part of the arm by a health care provider. Implants can be effective for up to 3 years. Progestin-only injections Progestin-only injections are injections of progestin, a synthetic form of the hormone progesterone. They are given every 3 months by a health care provider. Birth control pills Birth control pills are pills that contain hormones that prevent pregnancy. They must be taken once a day, preferably at the same time each day. A prescription is needed to use this method of contraception. Birth control patch The birth control patch contains hormones that prevent pregnancy. It is placed on the skin and must be changed once a week for three weeks and removed on the fourth week. A prescription is needed to use this method of contraception. Vaginal ring A vaginal ring contains hormones that prevent pregnancy. It is placed in the vagina for three weeks and removed on the fourth week. After that, the process is repeated with a new ring. A prescription is needed to use this method of contraception. Emergency contraceptive Emergency contraceptives prevent pregnancy after unprotected sex. They come in pill form and can be taken up to 5 days after sex. They work best the sooner they are taken after having sex. Most emergency contraceptives are available without a prescription. This method should not be used as your only form of birth control.   Barrier methods Female condom A female condom is a thin sheath that is worn over the penis during sex. Condoms keep sperm from going inside a woman's body. They can be used with a sperm-killing substance (spermicide) to increase their  effectiveness. They should be thrown away after one use. Female condom A female condom is a soft, loose-fitting sheath that is put into the vagina before sex. The condom keeps sperm from going inside a woman's body. They should be thrown away after one use. Diaphragm A diaphragm is a soft, dome-shaped barrier. It is inserted into the vagina before sex, along with a spermicide. The diaphragm blocks sperm from entering the uterus, and the spermicide kills sperm. A diaphragm should be left in the vagina for 6-8 hours after sex and removed within 24 hours. A diaphragm is prescribed and fitted by a health care provider. A diaphragm should be replaced every 1-2 years, after giving birth, after gaining more than 15 lb (6.8 kg), and after pelvic surgery. Cervical cap A cervical cap is a round, soft latex or plastic cup that fits over the cervix. It is inserted into the vagina before sex, along with spermicide. It blocks sperm from entering the uterus. The cap should be left in place for 6-8 hours after sex and removed within 48 hours. A cervical cap must be prescribed and fitted by a health care provider. It should be replaced every 2 years. Sponge A sponge is a soft, circular piece of polyurethane foam with spermicide in it. The sponge helps block sperm from entering the uterus, and the spermicide kills sperm. To use it, you make it wet and then insert it into the vagina. It should be inserted before sex, left in for at least 6 hours after sex, and removed and thrown away within 30 hours. Spermicides Spermicides are chemicals that kill or block sperm from entering the cervix   and uterus. They can come as a cream, jelly, suppository, foam, or tablet. A spermicide should be inserted into the vagina with an applicator at least 10-15 minutes before sex to allow time for it to work. The process must be repeated every time you have sex. Spermicides do not require a prescription.   Intrauterine  contraception Intrauterine device (IUD) An IUD is a T-shaped device that is put in a woman's uterus. There are two types:  Hormone IUD.This type contains progestin, a synthetic form of the hormone progesterone. This type can stay in place for 3-5 years.  Copper IUD.This type is wrapped in copper wire. It can stay in place for 10 years. Permanent methods of contraception Female tubal ligation In this method, a woman's fallopian tubes are sealed, tied, or blocked during surgery to prevent eggs from traveling to the uterus. Hysteroscopic sterilization In this method, a small, flexible insert is placed into each fallopian tube. The inserts cause scar tissue to form in the fallopian tubes and block them, so sperm cannot reach an egg. The procedure takes about 3 months to be effective. Another form of birth control must be used during those 3 months. Female sterilization This is a procedure to tie off the tubes that carry sperm (vasectomy). After the procedure, the man can still ejaculate fluid (semen). Another form of birth control must be used for 3 months after the procedure. Natural planning methods Natural family planning In this method, a couple does not have sex on days when the woman could become pregnant. Calendar method In this method, the woman keeps track of the length of each menstrual cycle, identifies the days when pregnancy can happen, and does not have sex on those days. Ovulation method In this method, a couple avoids sex during ovulation. Symptothermal method This method involves not having sex during ovulation. The woman typically checks for ovulation by watching changes in her temperature and in the consistency of cervical mucus. Post-ovulation method In this method, a couple waits to have sex until after ovulation. Where to find more information  Centers for Disease Control and Prevention: FootballExhibition.com.br Summary  Contraception, also called birth control, refers to methods or  devices that prevent pregnancy.  Hormonal methods of contraception include implants, injections, pills, patches, vaginal rings, and emergency contraceptives.  Barrier methods of contraception can include female condoms, female condoms, diaphragms, cervical caps, sponges, and spermicides.  There are two types of IUDs (intrauterine devices). An IUD can be put in a woman's uterus to prevent pregnancy for 3-5 years.  Permanent sterilization can be done through a procedure for males and females. Natural family planning methods involve nothaving sex on days when the woman could become pregnant. This information is not intended to replace advice given to you by your health care provider. Make sure you discuss any questions you have with your health care provider. Document Revised: 12/20/2019 Document Reviewed: 12/20/2019 Elsevier Patient Education  2021 Elsevier Inc. EXERCISE   We recommended that you start or continue a regular exercise program for good health. Physical activity is anything that gets your body moving, some is better than none. The CDC recommends 150 minutes per week of Moderate-Intensity Aerobic Activity and 2 or more days of Muscle Strengthening Activity.  Benefits of exercise are limitless: helps weight loss/weight maintenance, improves mood and energy, helps with depression and anxiety, improves sleep, tones and strengthens muscles, improves balance, improves bone density, protects from chronic conditions such as heart disease, high blood pressure and diabetes and so  much more. To learn more visit: http://kirby-bean.org/  DIET: Good nutrition starts with a healthy diet of fruits, vegetables, whole grains, and lean protein sources. Drink plenty of water for hydration. Minimize empty calories, sodium, sweets. For more information about dietary recommendations visit: CriticalGas.be and  https://www.carpenter-henry.info/  ALCOHOL:  Women should limit their alcohol intake to no more than 7 drinks/beers/glasses of wine (combined, not each!) per week. Moderation of alcohol intake to this level decreases your risk of breast cancer and liver damage.  If you are concerned that you may have a problem, or your friends have told you they are concerned about your drinking, there are many resources to help. A well-known program that is free, effective, and available to all people all over the nation is Alcoholics Anonymous.  Check out this site to learn more: BeverageBargains.co.za   CALCIUM AND VITAMIN D:  Adequate intake of calcium and Vitamin D are recommended for bone health.  You should be getting between 1000-1200 mg of calcium and 800 units of Vitamin D daily between diet and supplements  PAP SMEARS:  Pap smears, to check for cervical cancer or precancers,  have traditionally been done yearly, scientific advances have shown that most women can have pap smears less often.  However, every woman still should have a physical exam from her gynecologist every year. It will include a breast check, inspection of the vulva and vagina to check for abnormal growths or skin changes, a visual exam of the cervix, and then an exam to evaluate the size and shape of the uterus and ovaries. We will also provide age appropriate advice regarding health maintenance, like when you should have certain vaccines, screening for sexually transmitted diseases, bone density testing, colonoscopy, mammograms, etc.   MAMMOGRAMS:  All women over 71 years old should have a routine mammogram.   COLON CANCER SCREENING: Now recommend starting at age 78. At this time colonoscopy is not covered for routine screening until 50. There are take home tests that can be done between 45-49.   COLONOSCOPY:  Colonoscopy to screen for colon cancer is recommended for all women at age 4.  We know, you hate the idea of the prep.  We agree, BUT, having  colon cancer and not knowing it is worse!!  Colon cancer so often starts as a polyp that can be seen and removed at colonscopy, which can quite literally save your life!  And if your first colonoscopy is normal and you have no family history of colon cancer, most women don't have to have it again for 10 years.  Once every ten years, you can do something that may end up saving your life, right?  We will be happy to help you get it scheduled when you are ready.  Be sure to check your insurance coverage so you understand how much it will cost.  It may be covered as a preventative service at no cost, but you should check your particular policy.      Breast Self-Awareness Breast self-awareness means being familiar with how your breasts look and feel. It involves checking your breasts regularly and reporting any changes to your health care provider. Practicing breast self-awareness is important. A change in your breasts can be a sign of a serious medical problem. Being familiar with how your breasts look and feel allows you to find any problems early, when treatment is more likely to be successful. All women should practice breast self-awareness, including women who have had breast implants. How to  do a breast self-exam One way to learn what is normal for your breasts and whether your breasts are changing is to do a breast self-exam. To do a breast self-exam: Look for Changes  1. Remove all the clothing above your waist. 2. Stand in front of a mirror in a room with good lighting. 3. Put your hands on your hips. 4. Push your hands firmly downward. 5. Compare your breasts in the mirror. Look for differences between them (asymmetry), such as: ? Differences in shape. ? Differences in size. ? Puckers, dips, and bumps in one breast and not the other. 6. Look at each breast for changes in your skin, such as: ? Redness. ? Scaly areas. 7. Look for changes in your nipples, such  as: ? Discharge. ? Bleeding. ? Dimpling. ? Redness. ? A change in position. Feel for Changes Carefully feel your breasts for lumps and changes. It is best to do this while lying on your back on the floor and again while sitting or standing in the shower or tub with soapy water on your skin. Feel each breast in the following way:  Place the arm on the side of the breast you are examining above your head.  Feel your breast with the other hand.  Start in the nipple area and make  inch (2 cm) overlapping circles to feel your breast. Use the pads of your three middle fingers to do this. Apply light pressure, then medium pressure, then firm pressure. The light pressure will allow you to feel the tissue closest to the skin. The medium pressure will allow you to feel the tissue that is a little deeper. The firm pressure will allow you to feel the tissue close to the ribs.  Continue the overlapping circles, moving downward over the breast until you feel your ribs below your breast.  Move one finger-width toward the center of the body. Continue to use the  inch (2 cm) overlapping circles to feel your breast as you move slowly up toward your collarbone.  Continue the up and down exam using all three pressures until you reach your armpit.  Write Down What You Find  Write down what is normal for each breast and any changes that you find. Keep a written record with breast changes or normal findings for each breast. By writing this information down, you do not need to depend only on memory for size, tenderness, or location. Write down where you are in your menstrual cycle, if you are still menstruating. If you are having trouble noticing differences in your breasts, do not get discouraged. With time you will become more familiar with the variations in your breasts and more comfortable with the exam. How often should I examine my breasts? Examine your breasts every month. If you are breastfeeding, the  best time to examine your breasts is after a feeding or after using a breast pump. If you menstruate, the best time to examine your breasts is 5-7 days after your period is over. During your period, your breasts are lumpier, and it may be more difficult to notice changes. When should I see my health care provider? See your health care provider if you notice:  A change in shape or size of your breasts or nipples.  A change in the skin of your breast or nipples, such as a reddened or scaly area.  Unusual discharge from your nipples.  A lump or thick area that was not there before.  Pain in your breasts.  Anything that concerns you.

## 2020-09-25 LAB — CYTOLOGY - PAP
Comment: NEGATIVE
Diagnosis: NEGATIVE
High risk HPV: NEGATIVE

## 2020-10-09 ENCOUNTER — Encounter: Payer: Self-pay | Admitting: Podiatry

## 2020-10-09 ENCOUNTER — Ambulatory Visit (INDEPENDENT_AMBULATORY_CARE_PROVIDER_SITE_OTHER): Payer: 59

## 2020-10-09 ENCOUNTER — Other Ambulatory Visit: Payer: Self-pay | Admitting: Podiatry

## 2020-10-09 ENCOUNTER — Ambulatory Visit (INDEPENDENT_AMBULATORY_CARE_PROVIDER_SITE_OTHER): Payer: 59 | Admitting: Podiatry

## 2020-10-09 ENCOUNTER — Other Ambulatory Visit: Payer: Self-pay

## 2020-10-09 DIAGNOSIS — M79671 Pain in right foot: Secondary | ICD-10-CM

## 2020-10-09 DIAGNOSIS — M722 Plantar fascial fibromatosis: Secondary | ICD-10-CM

## 2020-10-09 MED ORDER — DICLOFENAC SODIUM 75 MG PO TBEC: 75.0000 mg | DELAYED_RELEASE_TABLET | Freq: Two times a day (BID) | ORAL | 2 refills | Status: DC

## 2020-10-09 MED ORDER — TRIAMCINOLONE ACETONIDE 10 MG/ML IJ SUSP
10.0000 mg | Freq: Once | INTRAMUSCULAR | Status: AC
  Administered 2020-10-09: 10 mg

## 2020-10-09 NOTE — Progress Notes (Signed)
Subjective:   Patient ID: Jamie Holder, female   DOB: 35 y.o.   MRN: [id number]   HPI Patient presents stating she has had severe pain in her left over right heel for a while and she has 2 young children that she has to be in charge of.  Also states that she has had some enlargement of the big toe joint right over left nonpainful currently and patient likes to be active and does not smoke   Review of Systems  All other systems reviewed and are negative.       Objective:  Physical Exam Vitals and nursing note reviewed.  Constitutional:      Appearance: She is well-developed.  Pulmonary:     Effort: Pulmonary effort is normal.  Musculoskeletal:        General: Normal range of motion.  Skin:    General: Skin is warm.  Neurological:     Mental Status: She is alert.     Neurovascular status intact muscle strength adequate range of motion adequate.  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

## 2020-10-09 NOTE — Patient Instructions (Signed)

## 2020-10-16 MED FILL — FLUoxetine HCL 40 MG CAPS: 40 | 90 days supply | Qty: 90 | Fill #0

## 2020-10-17 ENCOUNTER — Other Ambulatory Visit: Payer: Self-pay | Admitting: *Deleted

## 2020-10-17 ENCOUNTER — Other Ambulatory Visit: Payer: Self-pay | Admitting: Physician Assistant

## 2020-10-17 MED ORDER — FLUOXETINE HCL 40 MG PO CAPS: 40.0000 mg | ORAL_CAPSULE | Freq: Every day | ORAL | 1 refills | Status: DC

## 2020-10-19 ENCOUNTER — Other Ambulatory Visit (HOSPITAL_BASED_OUTPATIENT_CLINIC_OR_DEPARTMENT_OTHER): Payer: Self-pay

## 2020-10-24 ENCOUNTER — Other Ambulatory Visit (HOSPITAL_BASED_OUTPATIENT_CLINIC_OR_DEPARTMENT_OTHER): Payer: Self-pay

## 2020-10-26 ENCOUNTER — Encounter: Payer: Self-pay | Admitting: Podiatry

## 2020-10-26 ENCOUNTER — Other Ambulatory Visit: Payer: Self-pay

## 2020-10-26 ENCOUNTER — Ambulatory Visit (INDEPENDENT_AMBULATORY_CARE_PROVIDER_SITE_OTHER): Payer: 59 | Admitting: Podiatry

## 2020-10-26 DIAGNOSIS — M722 Plantar fascial fibromatosis: Secondary | ICD-10-CM | POA: Diagnosis not present

## 2020-10-26 NOTE — Progress Notes (Signed)
Subjective:   Patient ID: Jamie Holder, female   DOB: 35 y.o.   MRN: [id number]   HPI Patient states she is quite improved but did have long-term history of plantar fasciitis and also has issues with the big toe joints bilateral with moderate enlargement around the joint surface    ROS      Objective:  Physical Exam  Neurovascular status intact with patient found to have moderate inflammation still noted plantar fascial bilateral with depression of the arch and functional issues around the big toe joint bilateral     Assessment:  Planter fasciitis moderating but young age to have such severe condition bilateral along with hallux limitus functional bilateral      Plan:  H&P reviewed condition and went ahead today recommended long-term orthotics and utilization of reverse Morton's extension.  Castings done today educated her on long-term orthotic usage continue physical therapy anti-inflammatory splints and stretching

## 2020-11-13 DIAGNOSIS — G8929 Other chronic pain: Secondary | ICD-10-CM | POA: Diagnosis not present

## 2020-11-13 DIAGNOSIS — N62 Hypertrophy of breast: Secondary | ICD-10-CM | POA: Diagnosis not present

## 2020-11-13 DIAGNOSIS — M549 Dorsalgia, unspecified: Secondary | ICD-10-CM | POA: Diagnosis not present

## 2020-11-13 DIAGNOSIS — M542 Cervicalgia: Secondary | ICD-10-CM | POA: Diagnosis not present

## 2020-11-13 DIAGNOSIS — F419 Anxiety disorder, unspecified: Secondary | ICD-10-CM | POA: Insufficient documentation

## 2020-11-13 MED FILL — Omeprazole Cap Delayed Release 20 MG: ORAL | 90 days supply | Qty: 90 | Fill #0 | Status: AC

## 2020-11-13 MED FILL — Diclofenac Sodium Tab Delayed Release 75 MG: ORAL | 25 days supply | Qty: 50 | Fill #0 | Status: AC

## 2020-11-14 ENCOUNTER — Other Ambulatory Visit (HOSPITAL_COMMUNITY): Payer: Self-pay

## 2020-11-15 ENCOUNTER — Other Ambulatory Visit (HOSPITAL_COMMUNITY): Payer: Self-pay

## 2020-12-18 ENCOUNTER — Other Ambulatory Visit (HOSPITAL_COMMUNITY): Payer: Self-pay

## 2020-12-18 MED FILL — Norethindrone Tab 0.35 MG: ORAL | 84 days supply | Qty: 84 | Fill #0 | Status: AC

## 2021-01-08 ENCOUNTER — Telehealth: Payer: Self-pay | Admitting: Internal Medicine

## 2021-01-08 NOTE — Telephone Encounter (Signed)
Called and spoke with Revonda Standard, pharmacist at [facility]  She states that Dr Katrinka Blazing called in rx for Flovent over the weekend but did not tell them what strength  I do not see record of rx being sent and pt never seen in clinic before  Please advise, thanks

## 2021-01-16 ENCOUNTER — Other Ambulatory Visit (HOSPITAL_COMMUNITY): Payer: Self-pay

## 2021-01-16 MED FILL — Fluoxetine HCl Cap 40 MG: ORAL | 90 days supply | Qty: 90 | Fill #0 | Status: AC

## 2021-02-13 ENCOUNTER — Other Ambulatory Visit: Payer: Self-pay | Admitting: Physician Assistant

## 2021-02-14 ENCOUNTER — Other Ambulatory Visit (HOSPITAL_COMMUNITY): Payer: Self-pay

## 2021-02-14 MED ORDER — OMEPRAZOLE 20 MG PO CPDR
DELAYED_RELEASE_CAPSULE | Freq: Every day | ORAL | 0 refills | Status: DC
  Filled 2021-02-14: qty 90, 90d supply, fill #0

## 2021-03-12 ENCOUNTER — Other Ambulatory Visit (HOSPITAL_COMMUNITY): Payer: Self-pay

## 2021-03-12 MED FILL — Norethindrone Tab 0.35 MG: ORAL | 84 days supply | Qty: 84 | Fill #1 | Status: AC

## 2021-04-16 MED FILL — Fluoxetine HCl Cap 40 MG: ORAL | 90 days supply | Qty: 90 | Fill #1 | Status: AC

## 2021-04-17 ENCOUNTER — Other Ambulatory Visit (HOSPITAL_COMMUNITY): Payer: Self-pay

## 2021-04-28 HISTORY — PX: BREAST REDUCTION SURGERY: SHX8

## 2021-04-30 ENCOUNTER — Other Ambulatory Visit (HOSPITAL_COMMUNITY): Payer: Self-pay

## 2021-04-30 MED ORDER — TRAMADOL HCL 50 MG PO TABS
ORAL_TABLET | ORAL | 0 refills | Status: DC
  Filled 2021-04-30: qty 20, 5d supply, fill #0

## 2021-05-07 ENCOUNTER — Other Ambulatory Visit: Payer: Self-pay

## 2021-05-07 ENCOUNTER — Other Ambulatory Visit (HOSPITAL_COMMUNITY): Payer: Self-pay

## 2021-05-07 ENCOUNTER — Ambulatory Visit (INDEPENDENT_AMBULATORY_CARE_PROVIDER_SITE_OTHER): Payer: 59 | Admitting: Physician Assistant

## 2021-05-07 ENCOUNTER — Encounter: Payer: Self-pay | Admitting: Physician Assistant

## 2021-05-07 VITALS — BP 140/80 | HR 98 | Temp 98.3°F | Ht 66.0 in | Wt 200.2 lb

## 2021-05-07 DIAGNOSIS — Z23 Encounter for immunization: Secondary | ICD-10-CM

## 2021-05-07 DIAGNOSIS — Z0001 Encounter for general adult medical examination with abnormal findings: Secondary | ICD-10-CM | POA: Diagnosis not present

## 2021-05-07 DIAGNOSIS — K219 Gastro-esophageal reflux disease without esophagitis: Secondary | ICD-10-CM

## 2021-05-07 DIAGNOSIS — E782 Mixed hyperlipidemia: Secondary | ICD-10-CM | POA: Diagnosis not present

## 2021-05-07 DIAGNOSIS — R030 Elevated blood-pressure reading, without diagnosis of hypertension: Secondary | ICD-10-CM

## 2021-05-07 DIAGNOSIS — F419 Anxiety disorder, unspecified: Secondary | ICD-10-CM | POA: Diagnosis not present

## 2021-05-07 DIAGNOSIS — E669 Obesity, unspecified: Secondary | ICD-10-CM | POA: Diagnosis not present

## 2021-05-07 MED ORDER — FLUOXETINE HCL 40 MG PO CAPS
ORAL_CAPSULE | Freq: Every day | ORAL | 3 refills | Status: DC
  Filled 2021-05-07: qty 90, fill #0
  Filled 2021-07-16: qty 90, 90d supply, fill #0
  Filled 2021-10-17: qty 90, 90d supply, fill #1
  Filled 2022-01-14: qty 90, 90d supply, fill #2
  Filled 2022-04-11 (×2): qty 90, 90d supply, fill #3

## 2021-05-07 MED ORDER — OMEPRAZOLE 20 MG PO CPDR
DELAYED_RELEASE_CAPSULE | Freq: Every day | ORAL | 3 refills | Status: DC
  Filled 2021-05-07: qty 90, 90d supply, fill #0
  Filled 2021-08-15: qty 90, 90d supply, fill #1
  Filled 2021-11-11: qty 90, 90d supply, fill #2
  Filled 2022-02-11: qty 90, 90d supply, fill #3

## 2021-05-07 NOTE — Patient Instructions (Addendum)
It was great to see you!  If consistently >140/90, call me and I will send in low dose amlodopine. Otherwise, follow-up in 6 months, sooner if concerns.  Check blood pressure 2 times per week.  Best of luck with your surgery.  Please make an appointment with the lab on your way out. I would like for you to return for lab work within 1-2 weeks. After midnight on the day of the lab draw, please do not eat anything. You may have water, black coffee, unsweetened tea.  Take care,  Lelon Mast

## 2021-05-07 NOTE — Progress Notes (Signed)
I acted as a Neurosurgeon for [employer], PA-C Corky Mull, LPN   Subjective:    Jamie Holder is a 35 y.o. female and is here for a comprehensive physical exam.  HPI  Health Maintenance Due  Topic Date Due   INFLUENZA VACCINE  02/26/2021    Acute Concerns: Fatigue -patient reports that she is experiencing fatigue lately.  She denies: Syncope, rectal bleeding, night sweats, unusual cough, unusual pain, concerns for sleep apnea.  She eats all food groups, denies vegan/vegetarianism.  Chronic Issues: GERD -- currently taking prilosec 20 mg daily and tolerating well, if she misses a dose then she has breakthrough symptoms the next day. Anxiety -- prozac 40 mg daily; is tolerating well.  Denies suicidal or homicidal ideation. Elevated blood pressure reading - Currently on no medications for this.  At home blood pressure readings are: Around 140/80. Patient denies chest pain, SOB, blurred vision, dizziness, unusual headaches, lower leg swelling. Denies excessive caffeine intake, stimulant usage, excessive alcohol intake, or increase in salt consumption.  BP Readings from Last 3 Encounters:  05/07/21 140/80  09/22/20 140/72  08/11/20 (!) 160/100   Hyperlipidemia --she is currently.  Has strong family history of high cholesterol.  Health Maintenance: Immunizations -- UTD, will give Flu shot today; has not had COVID illness PAP -- UTD, due 08/2023 Mammogram -- N/A Colonoscopy -- N/A Bone Density -- N/A Diet -- trouble with snacking at times Sleep habits -- overall ok Exercise -- staying active Current Weight -- Weight: 200 lb 4 oz (90.8 kg)  Weight History: Wt Readings from Last 10 Encounters:  05/07/21 200 lb 4 oz (90.8 kg)  09/22/20 187 lb (84.8 kg)  08/11/20 191 lb 8 oz (86.9 kg)  02/04/20 188 lb 9.6 oz (85.5 kg)   Body mass index is 32.32 kg/m. Mood -- overall stable  No LMP recorded. (Menstrual status: Oral contraceptives).  UTD with dentist; Due for eye exam    reports current alcohol use of about 6.0 standard drinks per week.  Tobacco Use: Low Risk    Smoking Tobacco Use: Never   Smokeless Tobacco Use: Never     Depression screen PHQ 2/9 05/07/2021  Decreased Interest 0  Down, Depressed, Hopeless 0  PHQ - 2 Score 0     Other providers/specialists: Patient Care Team: Jarold Motto, [place] as PCP - General (Physician Assistant) Rosemarie Beath, NP as Nurse Practitioner (Psychology) Melida Quitter, OD as Attending Physician (Optometry)   PMHx, SurgHx, SocialHx, Medications, and Allergies were reviewed in the Visit Navigator and updated as appropriate.   Past Medical History:  Diagnosis Date   Anxiety    GERD (gastroesophageal reflux disease) 2016     Past Surgical History:  Procedure Laterality Date   ESOPHAGOSCOPY W/ BOTOX INJECTION     WISDOM TOOTH EXTRACTION       Family History  Problem Relation Age of Onset   Depression Mother    Hearing loss Mother    Hyperlipidemia Mother    Hypertension Mother    Thyroid cancer Mother    Anxiety disorder Mother    Cancer Mother    Hyperlipidemia Father    Hypertension Father    Depression Sister    Anxiety disorder Sister    Heart attack Maternal Grandmother    Heart attack Paternal Grandmother    Breast cancer Neg Hx    Colon cancer Neg Hx    Osteoporosis Neg Hx     Social History   Tobacco Use   Smoking status:  Never   Smokeless tobacco: Never  Vaping Use   Vaping Use: Never used  Substance Use Topics   Alcohol use: Yes    Alcohol/week: 6.0 standard drinks    Types: 6 Glasses of wine per week   Drug use: Never    Review of Systems:   Review of Systems  Constitutional:  Negative for chills, fever and weight loss.  HENT:  Negative for hearing loss, sinus pain and sore throat.   Respiratory:  Negative for cough and hemoptysis.   Cardiovascular:  Negative for chest pain, palpitations, leg swelling and PND.  Gastrointestinal:  Negative for abdominal pain,  constipation, diarrhea, heartburn, nausea and vomiting.  Genitourinary:  Negative for dysuria, frequency and urgency.  Musculoskeletal:  Negative for back pain, myalgias and neck pain.  Skin:  Negative for itching and rash.  Neurological:  Negative for dizziness, tingling, seizures and headaches.  Endo/Heme/Allergies:  Negative for polydipsia.  Psychiatric/Behavioral:  Negative for depression. The patient is not nervous/anxious.    Objective:   BP 140/80 (BP Location: Left Arm, Patient Position: Sitting, Cuff Size: Large)   Pulse 98   Temp 98.3 F (36.8 C) (Temporal)   Ht 5\' 6"  (1.676 m)   Wt 200 lb 4 oz (90.8 kg)   SpO2 98%   BMI 32.32 kg/m   General Appearance:    Alert, cooperative, no distress, appears stated age  Head:    Normocephalic, without obvious abnormality, atraumatic  Eyes:    PERRL, conjunctiva/corneas clear, EOM's intact, fundi    benign, both eyes  Ears:    Normal TM's and external ear canals, both ears  Nose:   Nares normal, septum midline, mucosa normal, no drainage    or sinus tenderness  Throat:   Lips, mucosa, and tongue normal; teeth and gums normal  Neck:   Supple, symmetrical, trachea midline, no adenopathy;    thyroid:  no enlargement/tenderness/nodules; no carotid   bruit or JVD  Back:     Symmetric, no curvature, ROM normal, no CVA tenderness  Lungs:     Clear to auscultation bilaterally, respirations unlabored  Chest Wall:    No tenderness or deformity   Heart:    Regular rate and rhythm, S1 and S2 normal, no murmur, rub   or gallop  Breast Exam:    Deferred  Abdomen:     Soft, non-tender, bowel sounds active all four quadrants,    no masses, no organomegaly  Genitalia:    Deferred  Rectal:    Deferred  Extremities:   Extremities normal, atraumatic, no cyanosis or edema  Pulses:   2+ and symmetric all extremities  Skin:   Skin color, texture, turgor normal, no rashes or lesions  Lymph nodes:   Cervical, supraclavicular, and axillary nodes normal   Neurologic:   CNII-XII intact, normal strength, sensation and reflexes    throughout    Assessment/Plan:   Encounter for general adult medical examination with abnormal findings Today patient counseled on age appropriate routine health concerns for screening and prevention, each reviewed and up to date or declined. Immunizations reviewed and up to date or declined. Labs ordered and reviewed. Risk factors for depression reviewed and negative. Hearing function and visual acuity are intact. ADLs screened and addressed as needed. Functional ability and level of safety reviewed and appropriate. Education, counseling and referrals performed based on assessed risks today. Patient provided with a copy of personalized plan for preventive services.  Anxiety Well-controlled Continue Prozac 40 mg daily Follow-up in 6  months, sooner if concerns  Obesity, unspecified classification, unspecified obesity type, unspecified whether serious comorbidity present She is motivated to lose weight Provided emotional support  Gastroesophageal reflux disease, unspecified whether esophagitis present Stable with Prilosec 20 mg daily Suspect that this will improve with weight loss Follow-up in 6 months  Elevated blood pressure reading Technically within normal range according to JNC 8 guidelines I recommend that she continue to monitor blood pressure 1-2 times per week She is asymptomatic Recommend working on weight loss as able If blood pressure at home remains consistently greater than 140/90, will start low-dose amlodipine  Moderate mixed hyperlipidemia not requiring statin therapy Recommend returning for fasting blood work so I can get a better picture of her cholesterol Further recommendations based on results  Fatigue No specific etiology based on history  no red flags on discussion We will update thyroid per patient request Continue to work on healthy lifestyle as able  Patient  Counseling:   [x]     Nutrition: Stressed importance of moderation in sodium/caffeine intake, saturated fat and cholesterol, caloric balance, sufficient intake of fresh fruits, vegetables, fiber, calcium, iron, and 1 mg of folate supplement per day (for females capable of pregnancy).   [x]      Stressed the importance of regular exercise.    [x]     Substance Abuse: Discussed cessation/primary prevention of tobacco, alcohol, or other drug use; driving or other dangerous activities under the influence; availability of treatment for abuse.    [x]      Injury prevention: Discussed safety belts, safety helmets, smoke detector, smoking near bedding or upholstery.    [x]      Sexuality: Discussed sexually transmitted diseases, partner selection, use of condoms, avoidance of unintended pregnancy  and contraceptive alternatives.    [x]     Dental health: Discussed importance of regular tooth brushing, flossing, and dental visits.   [x]      Health maintenance and immunizations reviewed. Please refer to Health maintenance section.   CMA or LPN served as scribe during this visit. History, Physical, and Plan performed by medical provider. The above documentation has been reviewed and is accurate and complete.  , PA-C [facility]

## 2021-05-09 ENCOUNTER — Other Ambulatory Visit (INDEPENDENT_AMBULATORY_CARE_PROVIDER_SITE_OTHER): Payer: 59

## 2021-05-09 ENCOUNTER — Other Ambulatory Visit: Payer: Self-pay

## 2021-05-09 DIAGNOSIS — E782 Mixed hyperlipidemia: Secondary | ICD-10-CM | POA: Diagnosis not present

## 2021-05-09 DIAGNOSIS — R030 Elevated blood-pressure reading, without diagnosis of hypertension: Secondary | ICD-10-CM | POA: Diagnosis not present

## 2021-05-09 DIAGNOSIS — F419 Anxiety disorder, unspecified: Secondary | ICD-10-CM

## 2021-05-09 LAB — TSH: TSH: 0.93 u[IU]/mL (ref 0.35–5.50)

## 2021-05-09 LAB — CBC WITH DIFFERENTIAL/PLATELET
Basophils Absolute: 0 10*3/uL (ref 0.0–0.1)
Basophils Relative: 0.6 % (ref 0.0–3.0)
Eosinophils Absolute: 0.1 10*3/uL (ref 0.0–0.7)
Eosinophils Relative: 1.7 % (ref 0.0–5.0)
HCT: 38.9 % (ref 36.0–46.0)
Hemoglobin: 12.8 g/dL (ref 12.0–15.0)
Lymphocytes Relative: 28.2 % (ref 12.0–46.0)
Lymphs Abs: 2 10*3/uL (ref 0.7–4.0)
MCHC: 32.8 g/dL (ref 30.0–36.0)
MCV: 86.8 fl (ref 78.0–100.0)
Monocytes Absolute: 0.4 10*3/uL (ref 0.1–1.0)
Monocytes Relative: 6.2 % (ref 3.0–12.0)
Neutro Abs: 4.5 10*3/uL (ref 1.4–7.7)
Neutrophils Relative %: 63.3 % (ref 43.0–77.0)
Platelets: 258 10*3/uL (ref 150.0–400.0)
RBC: 4.49 Mil/uL (ref 3.87–5.11)
RDW: 13.8 % (ref 11.5–15.5)
WBC: 7 10*3/uL (ref 4.0–10.5)

## 2021-05-09 LAB — COMPREHENSIVE METABOLIC PANEL
ALT: 39 U/L — ABNORMAL HIGH (ref 0–35)
AST: 29 U/L (ref 0–37)
Albumin: 4.6 g/dL (ref 3.5–5.2)
Alkaline Phosphatase: 68 U/L (ref 39–117)
BUN: 12 mg/dL (ref 6–23)
CO2: 26 mEq/L (ref 19–32)
Calcium: 9.6 mg/dL (ref 8.4–10.5)
Chloride: 102 mEq/L (ref 96–112)
Creatinine, Ser: 0.8 mg/dL (ref 0.40–1.20)
GFR: 94.54 mL/min (ref >60.00)
Glucose, Bld: 92 mg/dL (ref 70–99)
Potassium: 4.1 mEq/L (ref 3.5–5.1)
Sodium: 136 mEq/L (ref 135–145)
Total Bilirubin: 0.3 mg/dL (ref 0.2–1.2)
Total Protein: 7.8 g/dL (ref 6.0–8.3)

## 2021-05-09 LAB — LIPID PANEL
Cholesterol: 210 mg/dL — ABNORMAL HIGH (ref 0–200)
HDL: 53.4 mg/dL (ref >39.00)
LDL Cholesterol: 126 mg/dL — ABNORMAL HIGH (ref 0–99)
NonHDL: 156.81
Total CHOL/HDL Ratio: 4
Triglycerides: 156 mg/dL — ABNORMAL HIGH (ref 0.0–149.0)
VLDL: 31.2 mg/dL (ref 0.0–40.0)

## 2021-05-11 DIAGNOSIS — Z411 Encounter for cosmetic surgery: Secondary | ICD-10-CM | POA: Diagnosis not present

## 2021-05-11 DIAGNOSIS — M542 Cervicalgia: Secondary | ICD-10-CM | POA: Diagnosis not present

## 2021-05-11 DIAGNOSIS — M549 Dorsalgia, unspecified: Secondary | ICD-10-CM | POA: Diagnosis not present

## 2021-05-11 DIAGNOSIS — N62 Hypertrophy of breast: Secondary | ICD-10-CM | POA: Diagnosis not present

## 2021-05-31 DIAGNOSIS — L7211 Pilar cyst: Secondary | ICD-10-CM | POA: Diagnosis not present

## 2021-06-06 ENCOUNTER — Other Ambulatory Visit (HOSPITAL_COMMUNITY): Payer: Self-pay

## 2021-06-06 MED FILL — Norethindrone Tab 0.35 MG: ORAL | 84 days supply | Qty: 84 | Fill #2 | Status: AC

## 2021-07-17 ENCOUNTER — Other Ambulatory Visit (HOSPITAL_COMMUNITY): Payer: Self-pay

## 2021-08-13 DIAGNOSIS — L538 Other specified erythematous conditions: Secondary | ICD-10-CM | POA: Diagnosis not present

## 2021-08-13 DIAGNOSIS — L7211 Pilar cyst: Secondary | ICD-10-CM | POA: Diagnosis not present

## 2021-08-13 DIAGNOSIS — R208 Other disturbances of skin sensation: Secondary | ICD-10-CM | POA: Diagnosis not present

## 2021-08-15 ENCOUNTER — Other Ambulatory Visit (HOSPITAL_COMMUNITY): Payer: Self-pay

## 2021-08-19 ENCOUNTER — Other Ambulatory Visit: Payer: Self-pay | Admitting: Obstetrics and Gynecology

## 2021-08-19 DIAGNOSIS — Z3009 Encounter for other general counseling and advice on contraception: Secondary | ICD-10-CM

## 2021-08-20 NOTE — Telephone Encounter (Signed)
Last annual exam was 08/2020 No annual exam scheduled yet.

## 2021-08-21 ENCOUNTER — Other Ambulatory Visit (HOSPITAL_COMMUNITY): Payer: Self-pay

## 2021-08-21 MED ORDER — NORETHINDRONE 0.35 MG PO TABS
1.0000 | ORAL_TABLET | Freq: Every day | ORAL | 0 refills | Status: DC
  Filled 2021-08-21: qty 84, 84d supply, fill #0

## 2021-10-09 NOTE — Progress Notes (Signed)
36 y.o. G41P2002 Married White or Caucasian Not Hispanic or Latino female here for annual exam.  She would like to discuss her birth control options. Last year she was switched from OCP's to POP's secondary to elevated BP.  Only occasional spotting, but having issues with acne. Sexually active, no pain. ? ?No bowel or bladder issues.  ?  ?She had a breast reduction in 10/22. ? ?No LMP recorded. (Menstrual status: Oral contraceptives).          ?Sexually active: Yes.    ?The current method of family planning is oral progesterone-only contraceptive.    ?Exercising: Yes.     Jamie Holder and walking  ?Smoker:  no ? ?Health Maintenance: ?Pap: 09/22/20 WNL Hr HPV Neg,  02/18/18 wnl  ?History of abnormal Pap:  no ?MMG:  none  ?BMD:   none ?Colonoscopy: none ?TDaP:  10/29/17  ?Gardasil: never has declined in the past  ? ? reports that she has never smoked. She has never used smokeless tobacco. She reports current alcohol use of about 6.0 standard drinks per week. She reports that she does not use drugs. 7 year old daughter, 105 year old son. 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   ? ?Past Medical History:  ?Diagnosis Date  ? Anxiety   ? GERD (gastroesophageal reflux disease) 2016  ? ? ?Past Surgical History:  ?Procedure Laterality Date  ? ABDOMINAL SURGERY    ? BREAST REDUCTION SURGERY  04/28/2021  ? ESOPHAGOSCOPY W/ BOTOX INJECTION    ? WISDOM TOOTH EXTRACTION    ? ? ?Current Outpatient Medications  ?Medication Sig Dispense Refill  ? cetirizine (ZYRTEC) 10 MG chewable tablet Chew 10 mg by mouth daily.    ? FLUoxetine (PROZAC) 40 MG capsule TAKE 1 CAPSULE BY MOUTH DAILY. 90 capsule 3  ? norethindrone (MICRONOR) 0.35 MG tablet Take 1 tablet (0.35 mg total) by mouth daily. 84 tablet 0  ? omeprazole (PRILOSEC) 20 MG capsule TAKE 1 CAPSULE BY MOUTH DAILY 90 capsule 3  ? ?No current facility-administered medications for this visit.  ? ? ?Family History  ?Problem Relation Age of Onset  ? Depression Mother   ? Hearing loss  Mother   ? Hyperlipidemia Mother   ? Hypertension Mother   ? Thyroid cancer Mother   ? Anxiety disorder Mother   ? Hyperlipidemia Father   ? Hypertension Father   ? Depression Sister   ? Anxiety disorder Sister   ? Heart attack Maternal Grandmother   ? Heart attack Paternal Grandmother   ? Breast cancer Neg Hx   ? Colon cancer Neg Hx   ? Osteoporosis Neg Hx   ? ? ?Review of Systems  ?All other systems reviewed and are negative. ? ?Exam:   ?BP 130/88   Pulse 77   Ht 5\' 6"  (1.676 m)   Wt 203 lb (92.1 kg)   SpO2 99%   BMI 32.77 kg/m?   Weight change: @WEIGHTCHANGE @ Height:   Height: 5\' 6"  (167.6 cm)  ?Ht Readings from Last 3 Encounters:  ?10/17/21 5\' 6"  (1.676 m)  ?05/07/21 5\' 6"  (1.676 m)  ?09/22/20 5\' 6"  (1.676 m)  ? ? ?General appearance: alert, cooperative and appears stated age ?Head: Normocephalic, without obvious abnormality, atraumatic ?Neck: no adenopathy, supple, symmetrical, trachea midline and thyroid normal to inspection and palpation ?Lungs: clear to auscultation bilaterally ?Cardiovascular: regular rate and rhythm ?Breasts:  1 cm, mobile, not tender lump in the right breast at 12 o'clock just inside the areolar  region. No other lumps, scarring noted from prior breast reduction.  ?Abdomen: soft, non-tender; non distended,  no masses,  no organomegaly ?Extremities: extremities normal, atraumatic, no cyanosis or edema ?Skin: Skin color, texture, turgor normal. No rashes or lesions ?Lymph nodes: Cervical, supraclavicular, and axillary nodes normal. ?No abnormal inguinal nodes palpated ?Neurologic: Grossly normal ? ? ?Pelvic: External genitalia:  no lesions ?             Urethra:  normal appearing urethra with no masses, tenderness or lesions ?             Bartholins and Skenes: normal    ?             Vagina: normal appearing vagina with normal color and discharge, no lesions ?             Cervix: no lesions ?              ?Bimanual Exam:  Uterus:   no masses or tenderness ?             Adnexa:  ?               Rectovaginal: Confirms ?              Anus:  normal sphincter tone, no lesions ? ?Jamie Holder chaperoned for the exam. ? ? ?1. Well woman exam ?No pap this year ?Labs with primary ?Discussed breast self exam ?Discussed calcium and vit D intake ? ?2. Encounter for surveillance of contraceptive pills ?Discussed other options for contraception. Not a good candidate for OCP's, could try condoms, diaphragm, paragard IUD or the mirena IUD. Also discussed vasectomy. ?Potentially interested in the mirena IUD, information given and reviewed. ?- norethindrone (MICRONOR) 0.35 MG tablet; Take 1 tablet (0.35 mg total) by mouth daily.  Dispense: 84 tablet; Refill: 3 ? ?3. Breast lump on right side at 12 o'clock position ?Will set up diagnostic imaging.  ? ? ?

## 2021-10-17 ENCOUNTER — Other Ambulatory Visit (HOSPITAL_COMMUNITY): Payer: Self-pay

## 2021-10-17 ENCOUNTER — Encounter: Payer: Self-pay | Admitting: Obstetrics and Gynecology

## 2021-10-17 ENCOUNTER — Ambulatory Visit (INDEPENDENT_AMBULATORY_CARE_PROVIDER_SITE_OTHER): Payer: 59 | Admitting: Obstetrics and Gynecology

## 2021-10-17 ENCOUNTER — Telehealth: Payer: Self-pay

## 2021-10-17 ENCOUNTER — Other Ambulatory Visit: Payer: Self-pay

## 2021-10-17 VITALS — BP 130/88 | HR 77 | Ht 66.0 in | Wt 203.0 lb

## 2021-10-17 DIAGNOSIS — Z01419 Encounter for gynecological examination (general) (routine) without abnormal findings: Secondary | ICD-10-CM

## 2021-10-17 DIAGNOSIS — Z3041 Encounter for surveillance of contraceptive pills: Secondary | ICD-10-CM

## 2021-10-17 DIAGNOSIS — N6315 Unspecified lump in the right breast, overlapping quadrants: Secondary | ICD-10-CM

## 2021-10-17 MED ORDER — NORETHINDRONE 0.35 MG PO TABS
1.0000 | ORAL_TABLET | Freq: Every day | ORAL | 3 refills | Status: DC
Start: 2021-10-17 — End: 2023-05-19
  Filled 2021-10-17 – 2021-11-11 (×2): qty 84, 84d supply, fill #0
  Filled 2022-01-25: qty 84, 84d supply, fill #1
  Filled 2022-04-26: qty 84, 84d supply, fill #2
  Filled 2022-07-18: qty 84, 84d supply, fill #3

## 2021-10-17 NOTE — Telephone Encounter (Signed)
FYI.  ?Scheduled for 11/01/21 @ 1130. Please arrive at 11:10.  ? ?Left detailed msg on pt's VM per DPR.  ?

## 2021-10-17 NOTE — Patient Instructions (Signed)

## 2021-10-17 NOTE — Telephone Encounter (Signed)
Jamie Bolk, MD  P [facility] Triage ?Please set up diagnostic imaging at the breast center, lump at 12 o'clock right breast, just inside areolar region.  ?Thanks,  ?[name]  ?

## 2021-10-18 ENCOUNTER — Other Ambulatory Visit (HOSPITAL_COMMUNITY): Payer: Self-pay

## 2021-11-01 ENCOUNTER — Ambulatory Visit
Admission: RE | Admit: 2021-11-01 | Discharge: 2021-11-01 | Disposition: A | Discharge status: Home / Self Care | Payer: 59 | Source: Ambulatory Visit | Attending: Obstetrics and Gynecology | Admitting: Obstetrics and Gynecology

## 2021-11-01 DIAGNOSIS — N6315 Unspecified lump in the right breast, overlapping quadrants: Secondary | ICD-10-CM

## 2021-11-01 DIAGNOSIS — N641 Fat necrosis of breast: Secondary | ICD-10-CM | POA: Diagnosis not present

## 2021-11-01 DIAGNOSIS — R922 Inconclusive mammogram: Secondary | ICD-10-CM | POA: Diagnosis not present

## 2021-11-05 ENCOUNTER — Other Ambulatory Visit (HOSPITAL_COMMUNITY): Payer: Self-pay

## 2021-11-05 ENCOUNTER — Encounter: Payer: Self-pay | Admitting: Physician Assistant

## 2021-11-05 ENCOUNTER — Ambulatory Visit: Payer: 59 | Admitting: Physician Assistant

## 2021-11-05 VITALS — BP 154/88 | HR 84 | Temp 98.3°F | Ht 66.0 in | Wt 204.2 lb

## 2021-11-05 DIAGNOSIS — K219 Gastro-esophageal reflux disease without esophagitis: Secondary | ICD-10-CM | POA: Diagnosis not present

## 2021-11-05 DIAGNOSIS — L709 Acne, unspecified: Secondary | ICD-10-CM | POA: Diagnosis not present

## 2021-11-05 DIAGNOSIS — R030 Elevated blood-pressure reading, without diagnosis of hypertension: Secondary | ICD-10-CM | POA: Diagnosis not present

## 2021-11-05 DIAGNOSIS — F419 Anxiety disorder, unspecified: Secondary | ICD-10-CM | POA: Diagnosis not present

## 2021-11-05 LAB — BASIC METABOLIC PANEL
BUN: 11 mg/dL (ref 6–23)
CO2: 25 mEq/L (ref 19–32)
Calcium: 9.5 mg/dL (ref 8.4–10.5)
Chloride: 103 mEq/L (ref 96–112)
Creatinine, Ser: 0.77 mg/dL (ref 0.40–1.20)
GFR: 98.64 mL/min (ref >60.00)
Glucose, Bld: 83 mg/dL (ref 70–99)
Potassium: 4.2 mEq/L (ref 3.5–5.1)
Sodium: 136 mEq/L (ref 135–145)

## 2021-11-05 MED ORDER — SPIRONOLACTONE 25 MG PO TABS
ORAL_TABLET | ORAL | 2 refills | Status: DC
  Filled 2021-11-05: qty 60, 37d supply, fill #0
  Filled 2021-12-10: qty 60, 30d supply, fill #1
  Filled 2022-01-06: qty 60, 30d supply, fill #2

## 2021-11-05 NOTE — Progress Notes (Signed)
Jamie Holder is a 36 y.o. female here for a follow up of a pre-existing problem. ? ?History of Present Illness:  ? ?Chief Complaint  ?Patient presents with  ? Anxiety  ?  Pt says she is doing good.  ? Gastroesophageal Reflux  ?  Pt says it is under control no nausea or vomiting.  ? c/o elevated blood pressure  ?  Pt is not checking Bp at home. She had it last checked at Dr. Hilaria Ota it was 135/88.   ? ? ?HPI ? ?Anxiety/Depression ?Pt is currently compliant with taking prozac 40 mg daily with no adverse effects. Pt expresses that this medication has been beneficial to them. At this time she is tolerating well. Denies SI/HI.  ? ?GERD ?Aleena continues to take prilosec 20 mg daily with no complications. She is managing well at this time. Denies concerning sx.  ? ?Elevated Blood Pressure Reading  ?Shanese is currently not on medication for this issue. States that while she believes that her BP when in office is nerve based, she is interested in trialing a medication. She has not been checking her BP at home often like she used to, but is going to work on this in the future. Denies chest pain, SOB, blurred vision, dizziness, unusual headaches, lower leg swelling, excessive caffeine intake, stimulant usage, excessive alcohol intake, or increase in salt consumption. ? ?Acne  ?Upon further discussion, pt does express that since changing her birth control to a progesterone only medication she has been experiencing what she calls hormonal acne. She has trialed multiple topical creams such as retin-A but this hasn't provided relief.  After some research she would like to know if it's possible to trial spironolactone for this issue as well as for her BP.  ? ? ?Past Medical History:  ?Diagnosis Date  ? Anxiety   ? GERD (gastroesophageal reflux disease) 2016  ? ?  ?Social History  ? ?Tobacco Use  ? Smoking status: Never  ? Smokeless tobacco: Never  ?Vaping Use  ? Vaping Use: Never used  ?Substance Use Topics  ? Alcohol use: Yes  ?   Alcohol/week: 6.0 standard drinks  ?  Types: 6 Glasses of wine per week  ? Drug use: Never  ? ? ?Past Surgical History:  ?Procedure Laterality Date  ? ABDOMINAL SURGERY    ? BREAST REDUCTION SURGERY  04/28/2021  ? ESOPHAGOSCOPY W/ BOTOX INJECTION    ? WISDOM TOOTH EXTRACTION    ? ? ?Family History  ?Problem Relation Age of Onset  ? Depression Mother   ? Hearing loss Mother   ? Hyperlipidemia Mother   ? Hypertension Mother   ? Thyroid cancer Mother   ? Anxiety disorder Mother   ? Hyperlipidemia Father   ? Hypertension Father   ? Depression Sister   ? Anxiety disorder Sister   ? Heart attack Maternal Grandmother   ? Heart attack Paternal Grandmother   ? Breast cancer Neg Hx   ? Colon cancer Neg Hx   ? Osteoporosis Neg Hx   ? ? ?No Known Allergies ? ?Current Medications:  ? ?Current Outpatient Medications:  ?  cetirizine (ZYRTEC) 10 MG chewable tablet, Chew 10 mg by mouth daily., Disp: , Rfl:  ?  FLUoxetine (PROZAC) 40 MG capsule, TAKE 1 CAPSULE BY MOUTH DAILY., Disp: 90 capsule, Rfl: 3 ?  norethindrone (MICRONOR) 0.35 MG tablet, Take 1 tablet (0.35 mg total) by mouth daily., Disp: 84 tablet, Rfl: 3 ?  omeprazole (PRILOSEC) 20 MG capsule, TAKE  1 CAPSULE BY MOUTH DAILY, Disp: 90 capsule, Rfl: 3  ? ?Review of Systems:  ? ?ROS ?Negative unless otherwise specified per HPI. ?Vitals:  ? ?Vitals:  ? 11/05/21 1013  ?BP: (!) 154/88  ?Pulse: 84  ?Temp: 98.3 ?F (36.8 ?C)  ?TempSrc: Temporal  ?SpO2: 98%  ?Weight: 204 lb 4 oz (92.6 kg)  ?Height: 5\' 6"  (1.676 m)  ?   ?Body mass index is 32.97 kg/m?. ? ?Physical Exam:  ? ?Physical Exam ?Vitals and nursing note reviewed.  ?Constitutional:   ?   General: She is not in acute distress. ?   Appearance: She is well-developed. She is not ill-appearing or toxic-appearing.  ?Cardiovascular:  ?   Rate and Rhythm: Normal rate and regular rhythm.  ?   Pulses: Normal pulses.  ?   Heart sounds: Normal heart sounds, S1 normal and S2 normal.  ?Pulmonary:  ?   Effort: Pulmonary effort is normal.  ?    Breath sounds: Normal breath sounds.  ?Skin: ?   General: Skin is warm and dry.  ?Neurological:  ?   Mental Status: She is alert.  ?   GCS: GCS eye subscore is 4. GCS verbal subscore is 5. GCS motor subscore is 6.  ?Psychiatric:     ?   Speech: Speech normal.     ?   Behavior: Behavior normal. Behavior is cooperative.  ? ? ?Assessment and Plan:  ? ?Anxiety ?Controlled ?No red flags;Denies SI/HI ?Continue Prozac 40 mg daily  ?I advised patient that if they develop any SI, to tell someone immediately and seek medical attention ?Follow up in 3-6 months, sooner if concerns ? ?Gastroesophageal reflux disease, unspecified whether esophagitis present ?Controlled  ?Continue prilosec 20 mg daily  ?Follow up if new/worsening symptoms or concerns occur ? ?Elevated blood pressure reading ?Uncontrolled ?Start spironolactone 25 mg daily ?Update BMP today to check K ?Monitor BP regularly 1-2 times a week, keep a log of readings for future review ?Encouraged patient to continue participating in healthy eating and daily exercise ?I advised patient that if BP readings are consistently >1, to reach out to office and medication will be adjusted ?Follow up in 3 months, sooner if concerns ? ?Acne, unspecified acne type ?No red flags ?Start spironolactone 25 mg daily, after two weeks, may increase to 25 mg BID ?Follow up if new/worsening symptoms or concerns occur ? ?I,Havlyn C Ratchford,acting as a scribe for [employer], PA.,have documented all relevant documentation on the behalf of Inda Coke, PA,as directed by  Inda Coke, PA while in the presence of Inda Coke, [place]. ? ?IInda Coke, PA, have reviewed all documentation for this visit. The documentation on 11/05/21 for the exam, diagnosis, procedures, and orders are all accurate and complete. ? ? ?Inda Coke, PA-C ? ?

## 2021-11-05 NOTE — Patient Instructions (Signed)
It was great to see you! ? ?We are going to start spironolactone -- start 25 mg in AM.  ?After two weeks, may increase in 25 mg in AM and 25 mg in afternoon. ? ?Please check blood pressures regularly. ? ?Let's follow-up in 3 months, sooner if you have concerns. ? ?Take care, ? ?Jarold Motto PA-C  ?

## 2021-11-12 ENCOUNTER — Other Ambulatory Visit (HOSPITAL_COMMUNITY): Payer: Self-pay

## 2021-12-10 ENCOUNTER — Other Ambulatory Visit (HOSPITAL_COMMUNITY): Payer: Self-pay

## 2022-01-07 ENCOUNTER — Other Ambulatory Visit (HOSPITAL_COMMUNITY): Payer: Self-pay

## 2022-01-15 ENCOUNTER — Other Ambulatory Visit (HOSPITAL_COMMUNITY): Payer: Self-pay

## 2022-01-25 ENCOUNTER — Other Ambulatory Visit (HOSPITAL_COMMUNITY): Payer: Self-pay

## 2022-02-04 ENCOUNTER — Encounter: Payer: Self-pay | Admitting: Physician Assistant

## 2022-02-04 ENCOUNTER — Other Ambulatory Visit (HOSPITAL_COMMUNITY): Payer: Self-pay

## 2022-02-04 ENCOUNTER — Ambulatory Visit: Payer: 59 | Admitting: Physician Assistant

## 2022-02-04 VITALS — BP 146/88 | HR 114 | Temp 97.6°F | Ht 66.0 in | Wt 197.2 lb

## 2022-02-04 DIAGNOSIS — R030 Elevated blood-pressure reading, without diagnosis of hypertension: Secondary | ICD-10-CM | POA: Diagnosis not present

## 2022-02-04 DIAGNOSIS — F419 Anxiety disorder, unspecified: Secondary | ICD-10-CM

## 2022-02-04 LAB — BASIC METABOLIC PANEL
BUN: 12 mg/dL (ref 6–23)
CO2: 22 mEq/L (ref 19–32)
Calcium: 9.7 mg/dL (ref 8.4–10.5)
Chloride: 102 mEq/L (ref 96–112)
Creatinine, Ser: 0.87 mg/dL (ref 0.40–1.20)
GFR: 85.04 mL/min (ref >60.00)
Glucose, Bld: 114 mg/dL — ABNORMAL HIGH (ref 70–99)
Potassium: 4 mEq/L (ref 3.5–5.1)
Sodium: 136 mEq/L (ref 135–145)

## 2022-02-04 MED ORDER — SPIRONOLACTONE 50 MG PO TABS
50.0000 mg | ORAL_TABLET | Freq: Two times a day (BID) | ORAL | 1 refills | Status: DC
  Filled 2022-02-04: qty 180, 90d supply, fill #0
  Filled 2022-05-08: qty 180, 90d supply, fill #1

## 2022-02-04 NOTE — Patient Instructions (Signed)
It was great to see you!  Increase your spironolactone to 50 mg twice daily  Continue to monitor your blood pressures occasionally  Let's follow-up in 3 months, sooner if you have concerns.  Take care,  Jarold Motto PA-C

## 2022-02-04 NOTE — Progress Notes (Signed)
Jamie Holder is a 36 y.o. female here for a follow up of a pre-existing problem.  History of Present Illness:   Chief Complaint  Patient presents with   Anxiety   f/u elevated blood pressure    Pt has been checking blood pressure at home averaging 135/140 systolic, 85-90 diastolic.    HPI  Elevated Blood Pressure Readings 3 months follow-up. She is currently compliant with taking Spironolactone 25 mg BID. Has been checking her blood pressure few times a week at home. Her BP ranges 135-140 systolic and 85-90 diastolic at home. Patient denies chest pain, SOB, blurred vision, dizziness, unusual headaches, lower leg swelling. Patient is  compliant with medication. Denies excessive caffeine intake, stimulant usage, excessive alcohol intake, or increase in salt consumption.   BP Readings from Last 3 Encounters:  02/04/22 (!) 146/88  11/05/21 (!) 154/88  10/17/21 130/88   Anxiety  Pt is currently compliant with taking prozac 40 mg daily with no adverse effects. She states that this medication has been beneficial to them. At this time she is tolerating well. Denies worsening anxiety sx. Denies SI/HI.  Past Medical History:  Diagnosis Date   Anxiety    GERD (gastroesophageal reflux disease) 2016     Social History   Tobacco Use   Smoking status: Never   Smokeless tobacco: Never  Vaping Use   Vaping Use: Never used  Substance Use Topics   Alcohol use: Yes    Alcohol/week: 6.0 standard drinks of alcohol    Types: 6 Glasses of wine per week   Drug use: Never    Past Surgical History:  Procedure Laterality Date   ABDOMINAL SURGERY     BREAST REDUCTION SURGERY  04/28/2021   ESOPHAGOSCOPY W/ BOTOX INJECTION     WISDOM TOOTH EXTRACTION      Family History  Problem Relation Age of Onset   Depression Mother    Hearing loss Mother    Hyperlipidemia Mother    Hypertension Mother    Thyroid cancer Mother    Anxiety disorder Mother    Hyperlipidemia Father    Hypertension Father     Depression Sister    Anxiety disorder Sister    Heart attack Maternal Grandmother    Heart attack Paternal Grandmother    Breast cancer Neg Hx    Colon cancer Neg Hx    Osteoporosis Neg Hx     No Known Allergies  Current Medications:   Current Outpatient Medications:    cetirizine (ZYRTEC) 10 MG chewable tablet, Chew 10 mg by mouth daily., Disp: , Rfl:    FLUoxetine (PROZAC) 40 MG capsule, TAKE 1 CAPSULE BY MOUTH DAILY., Disp: 90 capsule, Rfl: 3   norethindrone (MICRONOR) 0.35 MG tablet, Take 1 tablet (0.35 mg total) by mouth daily., Disp: 84 tablet, Rfl: 3   omeprazole (PRILOSEC) 20 MG capsule, TAKE 1 CAPSULE BY MOUTH DAILY, Disp: 90 capsule, Rfl: 3   Review of Systems:   ROS Negative unless otherwise specified per HPI.   Vitals:   Vitals:   02/04/22 1001  BP: (!) 146/88  Pulse: (!) 114  Temp: 97.6 F (36.4 C)  TempSrc: Temporal  Weight: 197 lb 4 oz (89.5 kg)  Height: 5\' 6"  (1.676 m)     Body mass index is 31.84 kg/m.  Physical Exam:   Physical Exam Vitals and nursing note reviewed.  Constitutional:      General: She is not in acute distress.    Appearance: She is well-developed. She is not  ill-appearing or toxic-appearing.  Cardiovascular:     Rate and Rhythm: Normal rate and regular rhythm.     Pulses: Normal pulses.     Heart sounds: Normal heart sounds, S1 normal and S2 normal.  Pulmonary:     Effort: Pulmonary effort is normal.     Breath sounds: Normal breath sounds.  Skin:    General: Skin is warm and dry.  Neurological:     Mental Status: She is alert.     GCS: GCS eye subscore is 4. GCS verbal subscore is 5. GCS motor subscore is 6.  Psychiatric:        Speech: Speech normal.        Behavior: Behavior normal. Behavior is cooperative.     Assessment and Plan:   Elevated blood pressure reading Above goal Asymptomatic -- no evidence of end-organ damage Continue to monitor BP at home Will increase her spironolactone to 50 mg BID Check  BMP today Follow-up in 3 months, sooner if concerns If BP does not respond to this change, will likely stop and start new med  Anxiety Well controlled Continue prozac 40 mg daily  Follow-up in 3 months sooner if concerns   I,Savera Zaman,acting as a scribe for [employer], PA.,have documented all relevant documentation on the behalf of Jarold Motto, PA,as directed by  Jarold Motto, PA while in the presence of Jarold Motto, [place].   I, Jarold Motto, [place], have reviewed all documentation for this visit. The documentation on 02/04/22 for the exam, diagnosis, procedures, and orders are all accurate and complete.   Jarold Motto, PA-C

## 2022-02-06 ENCOUNTER — Ambulatory Visit: Payer: 59 | Admitting: Obstetrics and Gynecology

## 2022-02-12 ENCOUNTER — Other Ambulatory Visit (HOSPITAL_COMMUNITY): Payer: Self-pay

## 2022-03-05 ENCOUNTER — Other Ambulatory Visit (HOSPITAL_COMMUNITY): Payer: Self-pay

## 2022-04-11 ENCOUNTER — Other Ambulatory Visit (HOSPITAL_COMMUNITY): Payer: Self-pay

## 2022-04-22 ENCOUNTER — Encounter: Payer: Self-pay | Admitting: *Deleted

## 2022-04-26 ENCOUNTER — Other Ambulatory Visit (HOSPITAL_COMMUNITY): Payer: Self-pay

## 2022-05-06 ENCOUNTER — Encounter: Payer: 59 | Admitting: Physician Assistant

## 2022-05-08 ENCOUNTER — Ambulatory Visit (INDEPENDENT_AMBULATORY_CARE_PROVIDER_SITE_OTHER): Payer: 59 | Admitting: Physician Assistant

## 2022-05-08 ENCOUNTER — Other Ambulatory Visit: Payer: Self-pay | Admitting: Physician Assistant

## 2022-05-08 ENCOUNTER — Encounter: Payer: Self-pay | Admitting: Physician Assistant

## 2022-05-08 VITALS — BP 130/80 | HR 110 | Temp 97.7°F | Ht 67.0 in | Wt 182.0 lb

## 2022-05-08 DIAGNOSIS — Z23 Encounter for immunization: Secondary | ICD-10-CM

## 2022-05-08 DIAGNOSIS — F419 Anxiety disorder, unspecified: Secondary | ICD-10-CM

## 2022-05-08 DIAGNOSIS — Z0000 Encounter for general adult medical examination without abnormal findings: Secondary | ICD-10-CM

## 2022-05-08 DIAGNOSIS — R030 Elevated blood-pressure reading, without diagnosis of hypertension: Secondary | ICD-10-CM | POA: Diagnosis not present

## 2022-05-08 DIAGNOSIS — E663 Overweight: Secondary | ICD-10-CM | POA: Diagnosis not present

## 2022-05-08 LAB — CBC WITH DIFFERENTIAL/PLATELET
Basophils Absolute: 0 10*3/uL (ref 0.0–0.1)
Basophils Relative: 0.6 % (ref 0.0–3.0)
Eosinophils Absolute: 0.1 10*3/uL (ref 0.0–0.7)
Eosinophils Relative: 1.1 % (ref 0.0–5.0)
HCT: 38.9 % (ref 36.0–46.0)
Hemoglobin: 12.9 g/dL (ref 12.0–15.0)
Lymphocytes Relative: 24.1 % (ref 12.0–46.0)
Lymphs Abs: 1.9 10*3/uL (ref 0.7–4.0)
MCHC: 33.1 g/dL (ref 30.0–36.0)
MCV: 85.9 fl (ref 78.0–100.0)
Monocytes Absolute: 0.4 10*3/uL (ref 0.1–1.0)
Monocytes Relative: 5.3 % (ref 3.0–12.0)
Neutro Abs: 5.4 10*3/uL (ref 1.4–7.7)
Neutrophils Relative %: 68.9 % (ref 43.0–77.0)
Platelets: 312 10*3/uL (ref 150.0–400.0)
RBC: 4.53 Mil/uL (ref 3.87–5.11)
RDW: 13.5 % (ref 11.5–15.5)
WBC: 7.8 10*3/uL (ref 4.0–10.5)

## 2022-05-08 LAB — LIPID PANEL
Cholesterol: 220 mg/dL — ABNORMAL HIGH (ref 0–200)
HDL: 46.1 mg/dL (ref >39.00)
NonHDL: 174.04
Total CHOL/HDL Ratio: 5
Triglycerides: 348 mg/dL — ABNORMAL HIGH (ref 0.0–149.0)
VLDL: 69.6 mg/dL — ABNORMAL HIGH (ref 0.0–40.0)

## 2022-05-08 LAB — COMPREHENSIVE METABOLIC PANEL
ALT: 18 U/L (ref 0–35)
AST: 15 U/L (ref 0–37)
Albumin: 4.2 g/dL (ref 3.5–5.2)
Alkaline Phosphatase: 65 U/L (ref 39–117)
BUN: 9 mg/dL (ref 6–23)
CO2: 26 mEq/L (ref 19–32)
Calcium: 9.9 mg/dL (ref 8.4–10.5)
Chloride: 102 mEq/L (ref 96–112)
Creatinine, Ser: 0.85 mg/dL (ref 0.40–1.20)
GFR: 87.29 mL/min (ref >60.00)
Glucose, Bld: 95 mg/dL (ref 70–99)
Potassium: 4.7 mEq/L (ref 3.5–5.1)
Sodium: 136 mEq/L (ref 135–145)
Total Bilirubin: 0.3 mg/dL (ref 0.2–1.2)
Total Protein: 7.7 g/dL (ref 6.0–8.3)

## 2022-05-08 LAB — HEMOGLOBIN A1C: Hgb A1c MFr Bld: 5.4 % (ref 4.6–6.5)

## 2022-05-08 LAB — LDL CHOLESTEROL, DIRECT: Direct LDL: 142 mg/dL

## 2022-05-08 NOTE — Progress Notes (Signed)
Subjective:    Jamie Holder is a 36 y.o. female and is here for a comprehensive physical exam.  HPI  Blood pressure- Blood pressure is normal this visit and regularly checks it at home.   Family history: She reports no new changes in family medical history.  Health Maintenance Due  Topic Date Due   INFLUENZA VACCINE  02/26/2022    Acute Concerns: She reports no acute issues.  Chronic Issues: HTN Currently taking spironolactone 50 mg BID. At home blood pressure readings are: WNL. Patient denies chest pain, SOB, blurred vision, dizziness, unusual headaches, lower leg swelling. Patient is compliant with medication. Denies excessive caffeine intake, stimulant usage, excessive alcohol intake, or increase in salt consumption.  BP Readings from Last 3 Encounters:  05/08/22 130/80  02/04/22 (!) 146/88  11/05/21 (!) 154/88   Anxiety Overall stable but has noticed that she is feeling more overwhelmed and irritable. Taking prozac 40 mg daily. Would like a talk therapist.  Health Maintenance: Immunization- She had an influenza vaccine. Mammogram -- last completed on 11/01/2021. PAP -- last completed on 02/18/2018 Diet -- Reports to be making better food choices Exercise -- Reports to be exercising about 2-3 days a week with walking.  Sleep habits -- She reports to be sleeping alright, but kids occasionally keep her up at night.  Mood -- Patient reports to be more tired and irritable and complies with 40 mg Prozac. She reports to want to see a therapist for her mood additionally.  UTD with dentist? - She is UTD on dental care. UTD with eye doctor? - She is UTD on vision care.  Weight history: Patient has lost weight since last visit. Wt Readings from Last 3 Encounters:  05/08/22 182 lb (82.6 kg)  02/04/22 197 lb 4 oz (89.5 kg)  11/05/21 204 lb 4 oz (92.6 kg)    Wt Readings from Last 10 Encounters:  05/08/22 182 lb (82.6 kg)  02/04/22 197 lb 4 oz (89.5 kg)  11/05/21 204 lb 4  oz (92.6 kg)  10/17/21 203 lb (92.1 kg)  05/07/21 200 lb 4 oz (90.8 kg)  09/22/20 187 lb (84.8 kg)  08/11/20 191 lb 8 oz (86.9 kg)  02/04/20 188 lb 9.6 oz (85.5 kg)   Body mass index is 28.51 kg/m. No LMP recorded. (Menstrual status: Oral contraceptives).  Alcohol use:  reports current alcohol use of about 3.0 standard drinks of alcohol per week. This visit she reports to be drinking less than previously.  Tobacco use:  Tobacco Use: Low Risk  (05/08/2022)   Patient History    Smoking Tobacco Use: Never    Smokeless Tobacco Use: Never    Passive Exposure: Not on file       05/08/2022    9:45 AM  Depression screen PHQ 2/9  Decreased Interest 0  Down, Depressed, Hopeless 0  PHQ - 2 Score 0     Other providers/specialists: Patient Care Team: Inda Coke, [place] as PCP - General (Physician Assistant) Rosezella Rumpf, NP as Nurse Practitioner (Psychology) Jilda Roche, [place] as Attending Physician (Optometry)    PMHx, SurgHx, SocialHx, Medications, and Allergies were reviewed in the Visit Navigator and updated as appropriate.   Past Medical History:  Diagnosis Date   Anxiety    GERD (gastroesophageal reflux disease) 2016     Past Surgical History:  Procedure Laterality Date   ABDOMINAL SURGERY     BREAST REDUCTION SURGERY  04/28/2021   COSMETIC SURGERY  04/2021   Breast reduction and  abdominalplasty   ESOPHAGOSCOPY W/ BOTOX INJECTION     WISDOM TOOTH EXTRACTION       Family History  Problem Relation Age of Onset   Depression Mother    Hearing loss Mother    Hyperlipidemia Mother    Hypertension Mother    Thyroid cancer Mother    Anxiety disorder Mother    Hyperlipidemia Father    Hypertension Father    Depression Sister    Anxiety disorder Sister    Heart attack Maternal Grandmother    Heart attack Paternal Grandmother    Breast cancer Neg Hx    Colon cancer Neg Hx    Osteoporosis Neg Hx     Social History   Tobacco Use   Smoking status:  Never   Smokeless tobacco: Never  Vaping Use   Vaping Use: Never used  Substance Use Topics   Alcohol use: Yes    Alcohol/week: 3.0 standard drinks of alcohol    Types: 3 Standard drinks or equivalent per week   Drug use: Never    Review of Systems:   Review of Systems  Constitutional:  Negative for chills, fever, malaise/fatigue and weight loss.  HENT:  Negative for hearing loss, sinus pain and sore throat.   Respiratory:  Negative for cough and hemoptysis.   Cardiovascular:  Negative for chest pain, palpitations, leg swelling and PND.  Gastrointestinal:  Negative for abdominal pain, constipation, diarrhea, heartburn, nausea and vomiting.  Genitourinary:  Negative for dysuria, frequency and urgency.  Musculoskeletal:  Negative for back pain, myalgias and neck pain.  Skin:  Negative for itching and rash.  Endo/Heme/Allergies:  Negative for polydipsia.  Psychiatric/Behavioral:  Negative for depression. The patient is not nervous/anxious.     Objective:   BP 130/80 (BP Location: Left Arm, Patient Position: Sitting, Cuff Size: Normal)   Pulse (!) 110   Temp 97.7 F (36.5 C) (Temporal)   Ht 5\' 7"  (1.702 m)   Wt 182 lb (82.6 kg)   SpO2 99%   BMI 28.51 kg/m  Body mass index is 28.51 kg/m.   General Appearance:    Alert, cooperative, no distress, appears stated age  Head:    Normocephalic, without obvious abnormality, atraumatic  Eyes:    PERRL, conjunctiva/corneas clear, EOM's intact, fundi    benign, both eyes  Ears:    Normal TM's and external ear canals, both ears  Nose:   Nares normal, septum midline, mucosa normal, no drainage    or sinus tenderness  Throat:   Lips, mucosa, and tongue normal; teeth and gums normal  Neck:   Supple, symmetrical, trachea midline, no adenopathy;    thyroid:  no enlargement/tenderness/nodules; no carotid   bruit or JVD  Back:     Symmetric, no curvature, ROM normal, no CVA tenderness  Lungs:     Clear to auscultation bilaterally,  respirations unlabored  Chest Wall:    No tenderness or deformity   Heart:    Regular rate and rhythm, S1 and S2 normal, no murmur, rub or gallop  Breast Exam:    Deferred  Abdomen:     Soft, non-tender, bowel sounds active all four quadrants,    no masses, no organomegaly  Genitalia:    Deferred  Extremities:   Extremities normal, atraumatic, no cyanosis or edema  Pulses:   2+ and symmetric all extremities  Skin:   Skin color, texture, turgor normal, no rashes or lesions  Lymph nodes:   Cervical, supraclavicular, and axillary nodes normal  Neurologic:  CNII-XII intact, normal strength, sensation and reflexes    throughout    Assessment/Plan:   Routine physical examination Today patient counseled on age appropriate routine health concerns for screening and prevention, each reviewed and up to date or declined. Immunizations reviewed and up to date or declined. Labs ordered and reviewed. Risk factors for depression reviewed and negative. Hearing function and visual acuity are intact. ADLs screened and addressed as needed. Functional ability and level of safety reviewed and appropriate. Education, counseling and referrals performed based on assessed risks today. Patient provided with a copy of personalized plan for preventive services.   Elevated blood pressure reading Normotensive Continue spironolactone 50 mg BID --> potentially can decrease as she loses weight Check lytes and renal function today Follow-up in 6 months, sooner if concerns  Anxiety Uncontrolled Continue prozac 40 mg Talk therapy referral and options discussed  Overweight Doing well with semaglutide, receiving at [facility] Continue mgmt per other provider   I,Verona Buck,acting as a scribe for [employer], PA.,have documented all relevant documentation on the behalf of Jarold Motto, PA,as directed by  Jarold Motto, PA while in the presence of Jarold Motto, [place].  I, Jarold Motto, [place], have reviewed all  documentation for this visit. The documentation on 05/08/22 for the exam, diagnosis, procedures, and orders are all accurate and complete.  Jarold Motto, PA-C [place] [facility]

## 2022-05-08 NOTE — Patient Instructions (Addendum)
It was great to see you!  Referral for talk therapy placed today Jamie Holder --> [email or web address] Jamie Holder --> see card I gave you  Please go to the lab for blood work.   Our office will call you with your results unless you have chosen to receive results via MyChart.  If your blood work is normal we will follow-up each year for physicals and as scheduled for chronic medical problems.  If anything is abnormal we will treat accordingly and get you in for a follow-up.  Take care,  Aldona Bar

## 2022-05-09 ENCOUNTER — Other Ambulatory Visit (HOSPITAL_COMMUNITY): Payer: Self-pay

## 2022-05-09 MED ORDER — OMEPRAZOLE 20 MG PO CPDR
20.0000 mg | DELAYED_RELEASE_CAPSULE | Freq: Every day | ORAL | 3 refills | Status: DC
  Filled 2022-05-09: qty 90, 90d supply, fill #0
  Filled 2022-08-01: qty 90, 90d supply, fill #1
  Filled 2022-11-01: qty 90, 90d supply, fill #2
  Filled 2023-01-31: qty 90, 90d supply, fill #3

## 2022-05-10 ENCOUNTER — Other Ambulatory Visit (HOSPITAL_COMMUNITY): Payer: Self-pay

## 2022-07-08 ENCOUNTER — Ambulatory Visit (INDEPENDENT_AMBULATORY_CARE_PROVIDER_SITE_OTHER): Payer: 59 | Admitting: Behavioral Health

## 2022-07-08 DIAGNOSIS — F418 Other specified anxiety disorders: Secondary | ICD-10-CM

## 2022-07-08 NOTE — Progress Notes (Signed)
[place] Counselor Initial Adult Exam  Name: Jamie Holder Date: 07/08/2022 MRN: [id number] DOB: 02-24-1986 PCP: Inda Coke, PA  Time spent: 60 min In Person @ [facility] [place]:  Self    Paperwork requested: No   Reason for Visit /Presenting Problem: Over the past few mos, Pt is, "feeling a slump" & exp'g low E & motivation for the day ahead". She wants to learn how to handle her anger & frustration more effectively.  Mental Status Exam: Appearance:   Casual     Behavior:  Appropriate and Sharing  Motor:  Normal  Speech/Language:   Clear and Coherent and Normal Rate  Affect:  Appropriate  Mood:  anxious  Thought process:  normal  Thought content:    WNL  Sensory/Perceptual disturbances:    WNL  Orientation:  oriented to person, place, and time/date  Attention:  Good  Concentration:  Good  Memory:  WNL  Fund of knowledge:   Good  Insight:    Good  Judgment:   Good  Impulse Control:  Good    Risk Assessment: Danger to Self:  No Self-injurious Behavior: No Danger to Others: No Duty to Warn:no Physical Aggression / Violence:No  Access to Firearms a concern: No  Gang Involvement:No  Patient / guardian was educated about steps to take if suicide or homicide risk level increases between visits: yes While future psychiatric events cannot be accurately predicted, the patient does not currently require acute inpatient psychiatric care and does not currently meet [facility] involuntary commitment criteria.  Substance Abuse History: Current substance abuse: No     Past Psychiatric History:   Previous psychological history is significant for "post-partum rage " when Gilman Buttner was 32 mos old. Pt entered into psychotherapy for a year & found it helpful.  Pt sts awareness she has been anxious since the age of 36 or 6 due to social anx & anxiety due to stress Outpatient Providers:Samantha Morene Rankins, PA-C History of Psych Hospitalization: No   Psychological Testing:  NA    Abuse History:  Victim of: No.,  NA    Report needed: No. Victim of Neglect:No. Perpetrator of  NA   Witness / Exposure to Domestic Violence: No   Protective Services Involvement: No  Witness to [employer] Violence:  No   Family History:  Family History  Problem Relation Age of Onset   Depression Mother    Hearing loss Mother    Hyperlipidemia Mother    Hypertension Mother    Thyroid cancer Mother    Anxiety disorder Mother    Hyperlipidemia Father    Hypertension Father    Depression Sister    Anxiety disorder Sister    Heart attack Maternal Grandmother    Heart attack Paternal Grandmother    Breast cancer Neg Hx    Colon cancer Neg Hx    Osteoporosis Neg Hx     Living situation: the patient lives with their daughter  Sexual Orientation: Straight  Relationship Status: married  Name of spouse / other:[name] Dan If a parent, number of children / ages: 25yo Son Kerry Dory who attends [employer] until Noon & 38yo Dtr Clyde Canterbury who is in 2nd Gr & attends [employer]. Dtr also has 2 extra curricular activities; Dance in [place] @ [employer] in [place].  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 The past few mos she has felt similar to when she was in psychotherapy before for one year. She wants to handle anger & frustration w/her children more effectively & deal w/her own emotions better. She  also wants more emot'l intimacy w/her [name]. She wants to stop procrastinating & inc her E & motivation to have a good day.  Pt expresses a certain amt of feeling helpless w/the never-ending list of chores, tasks, & duties in the home that can overwhelm her.   Strengths: Supportive Relationships, Family, Friends, [place], Conservator, museum/gallery, and Able to Communicate Effectively  Barriers:  None noted today   Legal History: Pending legal issue / charges: The patient has no significant history of legal issues. History of legal issue / charges:  NA  Medical History/Surgical History: reviewed Past Medical History:  Diagnosis Date   Anxiety    GERD (gastroesophageal reflux disease) 2016    Past Surgical History:  Procedure Laterality Date   ABDOMINAL SURGERY     BREAST REDUCTION SURGERY  04/28/2021   COSMETIC SURGERY  04/2021   Breast reduction and abdominalplasty   ESOPHAGOSCOPY W/ BOTOX INJECTION     WISDOM TOOTH EXTRACTION      Medications: Current Outpatient Medications  Medication Sig Dispense Refill   cetirizine (ZYRTEC) 10 MG chewable tablet Chew 10 mg by mouth daily.     FLUoxetine (PROZAC) 40 MG capsule TAKE 1 CAPSULE BY MOUTH DAILY. 90 capsule 3   norethindrone (MICRONOR) 0.35 MG tablet Take 1 tablet (0.35 mg total) by mouth daily. 84 tablet 3   omeprazole (PRILOSEC) 20 MG capsule Take 1 capsule (20 mg total) by mouth daily. 90 capsule 3   SEMAGLUTIDE, 1 MG/DOSE, Betances      spironolactone (ALDACTONE) 50 MG tablet Take 1 tablet (50 mg total) by mouth 2 (two) times daily. 180 tablet 1   No current facility-administered medications for this visit.    No Known Allergies  Diagnoses:  Depression with anxiety  Plan of Care: mikylah ackroyd her many challenges in the life of a [place] today. She is feeling, "a slump similar to when she was 5 mos post-partum" w/her Gilman Buttner. She will attend twice monthly sessions as scheduled over the next few mos.  Target Date:  10/07/2022  Progress: 2  Frequency: Twice monthly starting after the Holidays  Modality: Sharelle Burditt is concerned for her relational needs w/her [name]. She wants him to open up more & share w/her @ the end of his day. She will keep a Notebook btwn sessions to record her thoughts & feelings. She will also note her observations re: how she & [name] complement ea other.  Target Date: 08/08/2022  Progress: 0  Frequency: Twice monthly starting after the Holidays  Modality: [name], LMFT

## 2022-07-08 NOTE — Progress Notes (Signed)
                Renae Mottley L Prospero Mahnke, LMFT 

## 2022-07-09 ENCOUNTER — Encounter: Payer: Self-pay | Admitting: Physician Assistant

## 2022-07-09 ENCOUNTER — Other Ambulatory Visit (HOSPITAL_COMMUNITY): Payer: Self-pay

## 2022-07-09 MED ORDER — FLUOXETINE HCL 20 MG PO CAPS
20.0000 mg | ORAL_CAPSULE | Freq: Every day | ORAL | 3 refills | Status: DC
  Filled 2022-07-09 – 2022-07-10 (×2): qty 90, 90d supply, fill #0
  Filled 2022-10-07: qty 90, 90d supply, fill #1
  Filled 2023-01-01: qty 90, 90d supply, fill #2
  Filled 2023-04-02: qty 90, 90d supply, fill #3

## 2022-07-09 MED ORDER — FLUOXETINE HCL 40 MG PO CAPS
40.0000 mg | ORAL_CAPSULE | Freq: Every day | ORAL | 3 refills | Status: DC
  Filled 2022-07-09 – 2022-07-10 (×2): qty 90, 90d supply, fill #0
  Filled 2022-10-07: qty 90, 90d supply, fill #1
  Filled 2023-01-01: qty 90, 90d supply, fill #2
  Filled 2023-04-02: qty 90, 90d supply, fill #3

## 2022-07-09 NOTE — Telephone Encounter (Signed)
Please see message and advise dosage of Fluoxetine? Pt currently taking 40 mg daily.

## 2022-07-10 ENCOUNTER — Other Ambulatory Visit: Payer: Self-pay

## 2022-07-10 ENCOUNTER — Other Ambulatory Visit (HOSPITAL_COMMUNITY): Payer: Self-pay

## 2022-07-18 ENCOUNTER — Other Ambulatory Visit: Payer: Self-pay

## 2022-08-01 ENCOUNTER — Other Ambulatory Visit: Payer: Self-pay | Admitting: Physician Assistant

## 2022-08-02 ENCOUNTER — Other Ambulatory Visit (HOSPITAL_COMMUNITY): Payer: Self-pay

## 2022-08-02 ENCOUNTER — Other Ambulatory Visit: Payer: Self-pay

## 2022-08-02 MED ORDER — SPIRONOLACTONE 50 MG PO TABS
50.0000 mg | ORAL_TABLET | Freq: Two times a day (BID) | ORAL | 1 refills | Status: DC
  Filled 2022-08-02: qty 180, 90d supply, fill #0
  Filled 2022-11-01: qty 180, 90d supply, fill #1

## 2022-08-03 ENCOUNTER — Other Ambulatory Visit (HOSPITAL_COMMUNITY): Payer: Self-pay

## 2022-08-06 ENCOUNTER — Ambulatory Visit (INDEPENDENT_AMBULATORY_CARE_PROVIDER_SITE_OTHER): Payer: 59 | Admitting: Behavioral Health

## 2022-08-06 DIAGNOSIS — F418 Other specified anxiety disorders: Secondary | ICD-10-CM

## 2022-08-06 NOTE — Progress Notes (Signed)
[place] Counselor/Therapist Progress Note  Patient ID: Jamie Holder, MRN: [id number],    Date: 08/06/2022  Time Spent: 33 min In Person @ [facility] Office   Treatment Type: Individual Therapy  Reported Symptoms: Pt requested from her Provider to titrate her Prozac dose upwards fllw'g our last visit & she sts that has helped her anxiety. She is taking 60mg  daily.   Mental Status Exam: Appearance:  Casual     Behavior: Appropriate and Sharing  Motor: Normal  Speech/Language:  Clear and Coherent  Affect: Appropriate  Mood: normal  Thought process: normal  Thought content:   WNL  Sensory/Perceptual disturbances:   WNL  Orientation: oriented to person, place, and time/date  Attention: Good  Concentration: Good  Memory: WNL  Fund of knowledge:  Good  Insight:   Good  Judgment:  Good  Impulse Control: Good   Risk Assessment: Danger to Self:  No Self-injurious Behavior: No Danger to Others: No Duty to Warn:no Physical Aggression / Violence:No  Access to Firearms a concern: No  Gang Involvement:No   Subjective: Pt is in positive mood today & reports she & [name] both caught the flu over Christmas Holiday & their day was low key & different than usual. It turned out fine, just different.  Pt & [name] are going on a vacation to [facility] this wknd for a week. She is excited to have private & relaxed time w/her [name]. She needs an emot'l connection to have a satisfying intimacy exp & she is learning how to meet her own & her [name] needs to accomplish this.  Interventions: Psycho-education/Bibliotherapy, Family Systems, and Relational Intimacy work from [place] with anxiety  Plan: Ginger wants to inc her closeness & relational strength w/her [name]. She will use vacation time to take mental notes about her efforts to shift/pivot their approach to ea other in positive ways. They will watch Ruby Waxx on YouTube together so [name]  can understand her exp of anxiety better. She will also find the book they exchanged on their Spring vacation to repeat her success.  Target Date: 08/20/2022  Progress: 5  Frequency: Twice monthly  Modality: Indiv (possible Cpl Th in the future)  Donnetta Hutching, LMFT

## 2022-08-06 NOTE — Progress Notes (Signed)
                Jamie Holder L [name], LMFT 

## 2022-08-20 ENCOUNTER — Ambulatory Visit (INDEPENDENT_AMBULATORY_CARE_PROVIDER_SITE_OTHER): Payer: 59 | Admitting: Behavioral Health

## 2022-08-20 DIAGNOSIS — F418 Other specified anxiety disorders: Secondary | ICD-10-CM | POA: Diagnosis not present

## 2022-08-20 NOTE — Progress Notes (Signed)
                Jacob Chamblee L Ica Daye, LMFT 

## 2022-08-20 NOTE — Progress Notes (Signed)
[place] Counselor/Therapist Progress Note  Patient ID: Jamie Holder, MRN: [id number],    Date: 08/20/2022  Time Spent: 58 min In Person @  [facility] Office    Treatment Type: Individual Therapy  Reported Symptoms: Reduction in anx/dep & anger issues since having a good vacation to [facility] w/her [name]. She is handling her anger around her children much more effectively since her self-care is stronger.  Mental Status Exam: Appearance:  Casual     Behavior: Appropriate and Sharing  Motor: Normal  Speech/Language:  Clear and Coherent  Affect: Appropriate  Mood: normal  Thought process: normal  Thought content:   WNL  Sensory/Perceptual disturbances:   WNL  Orientation: oriented to person, place, and time/date  Attention: Good  Concentration: Good  Memory: WNL  Fund of knowledge:  Good  Insight:   Good  Judgment:  Good  Impulse Control: Good   Risk Assessment: Danger to Self:  No Self-injurious Behavior: No Danger to Others: No Duty to Warn:no Physical Aggression / Violence:No  Access to Firearms a concern: No  Gang Involvement:No   Subjective: Pt is expressing her Family's dynamics & how this relates to her childhood & the future w/her Parents caring for their Grandchildren.   Interventions: Family Systems  Diagnosis:Depression with anxiety  Plan: Jamie Holder describes positive self-care practices that have enhanced her Parenting journey w/her Jamie Holder. She is noticing the difference her perspective-taking makes when she deals w/her children. She wants to be a stronger, sensitive Mother & stay healthy attending to her own needs.  Target Date: 09/20/2022  Progress: 6  Frequency: Twice monthly  Modality: [name], LMFT

## 2022-09-04 ENCOUNTER — Other Ambulatory Visit: Payer: Self-pay

## 2022-09-04 ENCOUNTER — Telehealth: Payer: Self-pay | Admitting: Pulmonary Disease

## 2022-09-04 ENCOUNTER — Other Ambulatory Visit (HOSPITAL_COMMUNITY): Payer: Self-pay

## 2022-09-04 MED ORDER — MONTELUKAST SODIUM 10 MG PO TABS
10.0000 mg | ORAL_TABLET | Freq: Every day | ORAL | 11 refills | Status: DC
  Filled 2022-09-04: qty 30, 30d supply, fill #0
  Filled 2022-10-07: qty 30, 30d supply, fill #1
  Filled 2022-11-01: qty 30, 30d supply, fill #2
  Filled 2022-12-04: qty 30, 30d supply, fill #3
  Filled 2023-01-01: qty 30, 30d supply, fill #4
  Filled 2023-01-31: qty 30, 30d supply, fill #5
  Filled 2023-03-12: qty 30, 30d supply, fill #6
  Filled 2023-04-15: qty 30, 30d supply, fill #7
  Filled 2023-05-13: qty 30, 30d supply, fill #8
  Filled 2023-06-20: qty 30, 30d supply, fill #9
  Filled 2023-07-30 – 2023-08-04 (×2): qty 30, 30d supply, fill #10

## 2022-09-04 NOTE — Telephone Encounter (Signed)
Phone Visit:   S: 37 yo recurrent seasonal allergies. Has used flonase and zrytec in the past. She has had minimal improvement with this. Nasal congestion and cough remain. She has tried other over the counter meds and has been on singulair in the past.  A: Seasonal Allergies  P: start singulair. New prescription sent to desired pharmacy  She is going to let us know if this medication is not helping so we can make changes.   Garner Nash, DO [facility] 09/04/2022 7:55 AM

## 2022-09-10 ENCOUNTER — Ambulatory Visit (INDEPENDENT_AMBULATORY_CARE_PROVIDER_SITE_OTHER): Payer: 59 | Admitting: Behavioral Health

## 2022-09-10 DIAGNOSIS — F418 Other specified anxiety disorders: Secondary | ICD-10-CM | POA: Diagnosis not present

## 2022-09-10 NOTE — Progress Notes (Signed)
[facility] Counselor/Therapist Progress Note  Patient ID: Jamie Holder, MRN: [id number],    Date: 09/10/2022  Time Spent: 53 min In Person @ [facility] Office   Treatment Type: Individual Therapy  Reported Symptoms: Expresses a lack of motivation, tendency to procrastinate, & problems w/perfectionism that promote her own sense of "not being enough". She c/o her [name] criticism @ times & her difficulty w/these exchanges.  Mental Status Exam: Appearance:  Casual     Behavior: Appropriate and Sharing  Motor: Normal  Speech/Language:  Clear and Coherent  Affect: Appropriate  Mood: normal  Thought process: normal  Thought content:   WNL  Sensory/Perceptual disturbances:   WNL  Orientation: oriented to person, place, and time/date  Attention: Good  Concentration: Good  Memory: WNL  Fund of knowledge:  Good  Insight:   Good  Judgment:  Good  Impulse Control: Good   Risk Assessment: Danger to Self:  No Self-injurious Behavior: No Danger to Others: No Duty to Warn:no Physical Aggression / Violence:No  Access to Firearms a concern: No  Gang Involvement:No   Subjective: Pt is upbeat today & also concerned for her lack of motivation & her tendency to procrastinate around the home w/chores. She cannot engage her Family in Team work that assists her in the mgmt of the functioning of the home. She is interested in engaging herself in self care that feeds her soul.  Interventions: Solution-Oriented/Positive Psychology and Insight-Oriented  Diagnosis:Depression with anxiety  Plan: Nykesha has a tendency to be perfectionistic & never having feelings of being enough. She will go to [employer] while her children are in [place] one day & explore Volunteer opportunities that fuel her academic side & her love of Biology. She will also call [facility] Volunteer Dept & inquire about playing a Volunteer role in [employer] that will promote her helping other women who have a hard time  adjusting to Motherhood.  Target Date: 09/25/2022  Progress: 1 or 2  Frequency: Twice monthly  Modality: [name], LMFT

## 2022-09-10 NOTE — Progress Notes (Signed)
                Jamie Holder Zayvien Canning, LMFT 

## 2022-09-16 ENCOUNTER — Observation Stay (HOSPITAL_BASED_OUTPATIENT_CLINIC_OR_DEPARTMENT_OTHER)
Admission: EM | Admit: 2022-09-16 | Discharge: 2022-09-18 | Disposition: A | Discharge status: Home / Self Care | Payer: 59 | Attending: Surgery | Admitting: Surgery

## 2022-09-16 ENCOUNTER — Emergency Department (HOSPITAL_BASED_OUTPATIENT_CLINIC_OR_DEPARTMENT_OTHER): Payer: 59

## 2022-09-16 ENCOUNTER — Encounter (HOSPITAL_BASED_OUTPATIENT_CLINIC_OR_DEPARTMENT_OTHER): Payer: Self-pay

## 2022-09-16 ENCOUNTER — Other Ambulatory Visit: Payer: Self-pay

## 2022-09-16 DIAGNOSIS — K8020 Calculus of gallbladder without cholecystitis without obstruction: Secondary | ICD-10-CM | POA: Diagnosis not present

## 2022-09-16 DIAGNOSIS — K429 Umbilical hernia without obstruction or gangrene: Secondary | ICD-10-CM | POA: Diagnosis not present

## 2022-09-16 DIAGNOSIS — K810 Acute cholecystitis: Secondary | ICD-10-CM | POA: Diagnosis present

## 2022-09-16 DIAGNOSIS — K8010 Calculus of gallbladder with chronic cholecystitis without obstruction: Principal | ICD-10-CM | POA: Insufficient documentation

## 2022-09-16 DIAGNOSIS — R1084 Generalized abdominal pain: Secondary | ICD-10-CM | POA: Diagnosis not present

## 2022-09-16 DIAGNOSIS — R1031 Right lower quadrant pain: Secondary | ICD-10-CM | POA: Diagnosis present

## 2022-09-16 HISTORY — DX: Nausea with vomiting, unspecified: R11.2

## 2022-09-16 HISTORY — DX: Other specified postprocedural states: Z98.890

## 2022-09-16 LAB — COMPREHENSIVE METABOLIC PANEL
ALT: 12 U/L (ref 0–44)
AST: 12 U/L — ABNORMAL LOW (ref 15–41)
Albumin: 4.6 g/dL (ref 3.5–5.0)
Alkaline Phosphatase: 61 U/L (ref 38–126)
Anion gap: 12 (ref 5–15)
BUN: 11 mg/dL (ref 6–20)
CO2: 25 mmol/L (ref 22–32)
Calcium: 10.1 mg/dL (ref 8.9–10.3)
Chloride: 102 mmol/L (ref 98–111)
Creatinine, Ser: 0.83 mg/dL (ref 0.44–1.00)
GFR, Estimated: >60 mL/min (ref >60)
Glucose, Bld: 127 mg/dL — ABNORMAL HIGH (ref 70–99)
Potassium: 3.8 mmol/L (ref 3.5–5.1)
Sodium: 139 mmol/L (ref 135–145)
Total Bilirubin: 0.2 mg/dL — ABNORMAL LOW (ref 0.3–1.2)
Total Protein: 7.9 g/dL (ref 6.5–8.1)

## 2022-09-16 LAB — CBC
HCT: 39.7 % (ref 36.0–46.0)
Hemoglobin: 12.9 g/dL (ref 12.0–15.0)
MCH: 28 pg (ref 26.0–34.0)
MCHC: 32.5 g/dL (ref 30.0–36.0)
MCV: 86.3 fL (ref 80.0–100.0)
Platelets: 359 10*3/uL (ref 150–400)
RBC: 4.6 MIL/uL (ref 3.87–5.11)
RDW: 13.6 % (ref 11.5–15.5)
WBC: 11.4 10*3/uL — ABNORMAL HIGH (ref 4.0–10.5)
nRBC: 0 % (ref 0.0–0.2)

## 2022-09-16 LAB — URINALYSIS, ROUTINE W REFLEX MICROSCOPIC
Bacteria, UA: NONE SEEN
Bilirubin Urine: NEGATIVE
Glucose, UA: NEGATIVE mg/dL
Leukocytes,Ua: NEGATIVE
Nitrite: NEGATIVE
Protein, ur: NEGATIVE mg/dL
Specific Gravity, Urine: 1.027 (ref 1.005–1.030)
pH: 5.5 (ref 5.0–8.0)

## 2022-09-16 LAB — PREGNANCY, URINE: Preg Test, Ur: NEGATIVE

## 2022-09-16 LAB — LIPASE, BLOOD: Lipase: 18 U/L (ref 11–51)

## 2022-09-16 MED ORDER — ONDANSETRON 4 MG PO TBDP: 4.0000 mg | ORAL_TABLET | Freq: Once | ORAL | Status: DC | PRN

## 2022-09-16 MED ORDER — HYDROMORPHONE HCL 1 MG/ML IJ SOLN
1.0000 mg | Freq: Once | INTRAMUSCULAR | Status: AC
  Administered 2022-09-16: 1 mg via INTRAVENOUS
  Filled 2022-09-16: qty 1

## 2022-09-16 MED ORDER — SODIUM CHLORIDE 0.9 % IV BOLUS
1000.0000 mL | Freq: Once | INTRAVENOUS | Status: AC
  Administered 2022-09-16: 1000 mL via INTRAVENOUS

## 2022-09-16 MED ORDER — IOHEXOL 300 MG/ML  SOLN
80.0000 mL | Freq: Once | INTRAMUSCULAR | Status: AC | PRN
  Administered 2022-09-16: 80 mL via INTRAVENOUS

## 2022-09-16 MED ORDER — ONDANSETRON HCL 4 MG/2ML IJ SOLN
4.0000 mg | Freq: Once | INTRAMUSCULAR | Status: AC
  Administered 2022-09-16: 4 mg via INTRAVENOUS
  Filled 2022-09-16: qty 2

## 2022-09-16 NOTE — ED Notes (Signed)
Patient to CT via stretcher.

## 2022-09-16 NOTE — ED Triage Notes (Signed)
Pt c/o lower back pain onset 2hrs ago, radiating into lower abd pain, associated NV but denies urinary symptoms. States she is down to bile after vomiting 4x today, unable to tolerate PO. Denies urinary complaint, GYN complaint.

## 2022-09-17 ENCOUNTER — Other Ambulatory Visit: Payer: Self-pay

## 2022-09-17 ENCOUNTER — Encounter (HOSPITAL_COMMUNITY)
Admission: EM | Disposition: A | Discharge status: Home / Self Care | Payer: Self-pay | Source: Home / Self Care | Attending: Emergency Medicine

## 2022-09-17 ENCOUNTER — Emergency Department (HOSPITAL_BASED_OUTPATIENT_CLINIC_OR_DEPARTMENT_OTHER): Payer: 59

## 2022-09-17 ENCOUNTER — Observation Stay (HOSPITAL_COMMUNITY): Payer: 59 | Admitting: Anesthesiology

## 2022-09-17 ENCOUNTER — Observation Stay (HOSPITAL_COMMUNITY): Payer: 59

## 2022-09-17 ENCOUNTER — Encounter (HOSPITAL_COMMUNITY): Payer: Self-pay

## 2022-09-17 ENCOUNTER — Observation Stay (HOSPITAL_BASED_OUTPATIENT_CLINIC_OR_DEPARTMENT_OTHER): Payer: 59 | Admitting: Anesthesiology

## 2022-09-17 DIAGNOSIS — K8020 Calculus of gallbladder without cholecystitis without obstruction: Secondary | ICD-10-CM | POA: Diagnosis not present

## 2022-09-17 DIAGNOSIS — R102 Pelvic and perineal pain: Secondary | ICD-10-CM | POA: Diagnosis not present

## 2022-09-17 DIAGNOSIS — K8000 Calculus of gallbladder with acute cholecystitis without obstruction: Secondary | ICD-10-CM | POA: Diagnosis not present

## 2022-09-17 DIAGNOSIS — D259 Leiomyoma of uterus, unspecified: Secondary | ICD-10-CM | POA: Diagnosis not present

## 2022-09-17 DIAGNOSIS — K810 Acute cholecystitis: Secondary | ICD-10-CM | POA: Diagnosis not present

## 2022-09-17 DIAGNOSIS — K82A1 Gangrene of gallbladder in cholecystitis: Secondary | ICD-10-CM | POA: Diagnosis not present

## 2022-09-17 HISTORY — PX: CHOLECYSTECTOMY: SHX55

## 2022-09-17 LAB — HIV ANTIBODY (ROUTINE TESTING W REFLEX): HIV Screen 4th Generation wRfx: NONREACTIVE

## 2022-09-17 SURGERY — LAPAROSCOPIC CHOLECYSTECTOMY WITH INTRAOPERATIVE CHOLANGIOGRAM
Anesthesia: General | Site: Abdomen

## 2022-09-17 MED ORDER — ONDANSETRON HCL 4 MG/2ML IJ SOLN
INTRAMUSCULAR | Status: DC | PRN
  Administered 2022-09-17: 4 mg via INTRAVENOUS

## 2022-09-17 MED ORDER — LACTATED RINGERS IV SOLN: INTRAVENOUS | Status: DC

## 2022-09-17 MED ORDER — ENOXAPARIN SODIUM 40 MG/0.4ML IJ SOSY
40.0000 mg | PREFILLED_SYRINGE | INTRAMUSCULAR | Status: DC
  Administered 2022-09-17 – 2022-09-18 (×2): 40 mg via SUBCUTANEOUS
  Filled 2022-09-17 (×2): qty 0.4

## 2022-09-17 MED ORDER — HYDROMORPHONE HCL 1 MG/ML IJ SOLN
1.0000 mg | Freq: Once | INTRAMUSCULAR | Status: AC
  Administered 2022-09-17: 1 mg via INTRAVENOUS
  Filled 2022-09-17: qty 1

## 2022-09-17 MED ORDER — KETOROLAC TROMETHAMINE 30 MG/ML IJ SOLN
INTRAMUSCULAR | Status: DC | PRN
  Administered 2022-09-17: 30 mg via INTRAVENOUS

## 2022-09-17 MED ORDER — BUPIVACAINE-EPINEPHRINE (PF) 0.25% -1:200000 IJ SOLN
INTRAMUSCULAR | Status: AC
  Filled 2022-09-17: qty 30

## 2022-09-17 MED ORDER — CHLORHEXIDINE GLUCONATE 0.12 % MT SOLN: 15.0000 mL | Freq: Once | OROMUCOSAL | Status: AC

## 2022-09-17 MED ORDER — FENTANYL CITRATE (PF) 250 MCG/5ML IJ SOLN
INTRAMUSCULAR | Status: DC | PRN
  Administered 2022-09-17 (×5): 50 ug via INTRAVENOUS

## 2022-09-17 MED ORDER — FLUOXETINE HCL 20 MG PO CAPS
20.0000 mg | ORAL_CAPSULE | Freq: Every day | ORAL | Status: DC
  Administered 2022-09-18: 20 mg via ORAL
  Filled 2022-09-17: qty 1

## 2022-09-17 MED ORDER — ONDANSETRON HCL 4 MG/2ML IJ SOLN
4.0000 mg | Freq: Once | INTRAMUSCULAR | Status: AC
  Administered 2022-09-17: 4 mg via INTRAVENOUS
  Filled 2022-09-17: qty 2

## 2022-09-17 MED ORDER — PANTOPRAZOLE SODIUM 40 MG PO TBEC
40.0000 mg | DELAYED_RELEASE_TABLET | Freq: Every day | ORAL | Status: DC
  Administered 2022-09-18: 40 mg via ORAL
  Filled 2022-09-17: qty 1

## 2022-09-17 MED ORDER — HYDROMORPHONE HCL 1 MG/ML IJ SOLN
INTRAMUSCULAR | Status: AC
  Filled 2022-09-17: qty 0.5

## 2022-09-17 MED ORDER — ACETAMINOPHEN 160 MG/5ML PO SOLN: 325.0000 mg | ORAL | Status: DC | PRN

## 2022-09-17 MED ORDER — SODIUM CHLORIDE 0.9 % IR SOLN
Status: DC | PRN
  Administered 2022-09-17: 1000 mL

## 2022-09-17 MED ORDER — ONDANSETRON 4 MG PO TBDP: 4.0000 mg | ORAL_TABLET | Freq: Four times a day (QID) | ORAL | Status: DC | PRN

## 2022-09-17 MED ORDER — SPIRONOLACTONE 25 MG PO TABS
50.0000 mg | ORAL_TABLET | Freq: Two times a day (BID) | ORAL | Status: DC
  Administered 2022-09-17 – 2022-09-18 (×2): 50 mg via ORAL
  Filled 2022-09-17 (×2): qty 2

## 2022-09-17 MED ORDER — PROPOFOL 10 MG/ML IV BOLUS
INTRAVENOUS | Status: DC | PRN
  Administered 2022-09-17: 50 mg via INTRAVENOUS

## 2022-09-17 MED ORDER — SCOPOLAMINE 1 MG/3DAYS TD PT72
MEDICATED_PATCH | TRANSDERMAL | Status: AC
  Filled 2022-09-17: qty 1

## 2022-09-17 MED ORDER — MIDAZOLAM HCL 2 MG/2ML IJ SOLN
INTRAMUSCULAR | Status: AC
  Filled 2022-09-17: qty 2

## 2022-09-17 MED ORDER — FENTANYL CITRATE (PF) 250 MCG/5ML IJ SOLN
INTRAMUSCULAR | Status: AC
  Filled 2022-09-17: qty 5

## 2022-09-17 MED ORDER — SODIUM CHLORIDE 0.9 % IV SOLN
2.0000 g | INTRAVENOUS | Status: DC
  Administered 2022-09-17 – 2022-09-18 (×2): 2 g via INTRAVENOUS
  Filled 2022-09-17 (×2): qty 20

## 2022-09-17 MED ORDER — HEMOSTATIC AGENTS (NO CHARGE) OPTIME
TOPICAL | Status: DC | PRN
  Administered 2022-09-17: 1

## 2022-09-17 MED ORDER — PROPOFOL 10 MG/ML IV BOLUS
INTRAVENOUS | Status: AC
  Filled 2022-09-17: qty 20

## 2022-09-17 MED ORDER — MELATONIN 3 MG PO TABS: 3.0000 mg | ORAL_TABLET | Freq: Every evening | ORAL | Status: DC | PRN

## 2022-09-17 MED ORDER — OXYCODONE HCL 5 MG PO TABS: 5.0000 mg | ORAL_TABLET | Freq: Once | ORAL | Status: DC | PRN

## 2022-09-17 MED ORDER — MONTELUKAST SODIUM 10 MG PO TABS
10.0000 mg | ORAL_TABLET | Freq: Every day | ORAL | Status: DC
  Administered 2022-09-17: 10 mg via ORAL
  Filled 2022-09-17: qty 1

## 2022-09-17 MED ORDER — 0.9 % SODIUM CHLORIDE (POUR BTL) OPTIME
TOPICAL | Status: DC | PRN
  Administered 2022-09-17: 1000 mL

## 2022-09-17 MED ORDER — FENTANYL CITRATE (PF) 100 MCG/2ML IJ SOLN
INTRAMUSCULAR | Status: AC
  Filled 2022-09-17: qty 2

## 2022-09-17 MED ORDER — DIPHENHYDRAMINE HCL 12.5 MG/5ML PO ELIX: 12.5000 mg | ORAL_SOLUTION | Freq: Four times a day (QID) | ORAL | Status: DC | PRN

## 2022-09-17 MED ORDER — HYDROMORPHONE HCL 1 MG/ML IJ SOLN
0.5000 mg | INTRAMUSCULAR | Status: DC | PRN
  Administered 2022-09-17 – 2022-09-18 (×4): 0.5 mg via INTRAVENOUS
  Filled 2022-09-17: qty 1
  Filled 2022-09-17 (×3): qty 0.5

## 2022-09-17 MED ORDER — CHLORHEXIDINE GLUCONATE 0.12 % MT SOLN
OROMUCOSAL | Status: AC
  Administered 2022-09-17: 15 mL via OROMUCOSAL
  Filled 2022-09-17: qty 15

## 2022-09-17 MED ORDER — ORAL CARE MOUTH RINSE: 15.0000 mL | Freq: Once | OROMUCOSAL | Status: AC

## 2022-09-17 MED ORDER — BUPIVACAINE-EPINEPHRINE 0.25% -1:200000 IJ SOLN
INTRAMUSCULAR | Status: DC | PRN
  Administered 2022-09-17: 30 mL

## 2022-09-17 MED ORDER — SUGAMMADEX SODIUM 200 MG/2ML IV SOLN
INTRAVENOUS | Status: DC | PRN
  Administered 2022-09-17: 200 mg via INTRAVENOUS

## 2022-09-17 MED ORDER — LIDOCAINE 2% (20 MG/ML) 5 ML SYRINGE
INTRAMUSCULAR | Status: DC | PRN
  Administered 2022-09-17: 60 mg via INTRAVENOUS

## 2022-09-17 MED ORDER — DEXAMETHASONE SODIUM PHOSPHATE 10 MG/ML IJ SOLN
INTRAMUSCULAR | Status: DC | PRN
  Administered 2022-09-17: 10 mg via INTRAVENOUS

## 2022-09-17 MED ORDER — ACETAMINOPHEN 500 MG PO TABS
1000.0000 mg | ORAL_TABLET | Freq: Four times a day (QID) | ORAL | Status: DC
  Administered 2022-09-17 – 2022-09-18 (×3): 1000 mg via ORAL
  Filled 2022-09-17 (×3): qty 2

## 2022-09-17 MED ORDER — ACETAMINOPHEN 325 MG PO TABS: 325.0000 mg | ORAL_TABLET | ORAL | Status: DC | PRN

## 2022-09-17 MED ORDER — IBUPROFEN 200 MG PO TABS: 600.0000 mg | ORAL_TABLET | Freq: Four times a day (QID) | ORAL | Status: DC | PRN

## 2022-09-17 MED ORDER — SIMETHICONE 80 MG PO CHEW: 40.0000 mg | CHEWABLE_TABLET | Freq: Four times a day (QID) | ORAL | Status: DC | PRN

## 2022-09-17 MED ORDER — ONDANSETRON HCL 4 MG/2ML IJ SOLN: 4.0000 mg | Freq: Once | INTRAMUSCULAR | Status: DC | PRN

## 2022-09-17 MED ORDER — FENTANYL CITRATE (PF) 100 MCG/2ML IJ SOLN: 25.0000 ug | INTRAMUSCULAR | Status: DC | PRN

## 2022-09-17 MED ORDER — MEPERIDINE HCL 25 MG/ML IJ SOLN: 6.2500 mg | INTRAMUSCULAR | Status: DC | PRN

## 2022-09-17 MED ORDER — FENTANYL CITRATE (PF) 100 MCG/2ML IJ SOLN: 50.0000 ug | Freq: Once | INTRAMUSCULAR | Status: DC

## 2022-09-17 MED ORDER — DOCUSATE SODIUM 100 MG PO CAPS
200.0000 mg | ORAL_CAPSULE | Freq: Two times a day (BID) | ORAL | Status: DC
  Administered 2022-09-17 – 2022-09-18 (×2): 200 mg via ORAL
  Filled 2022-09-17 (×2): qty 2

## 2022-09-17 MED ORDER — PHENYLEPHRINE 80 MCG/ML (10ML) SYRINGE FOR IV PUSH (FOR BLOOD PRESSURE SUPPORT)
PREFILLED_SYRINGE | INTRAVENOUS | Status: DC | PRN
  Administered 2022-09-17: 80 ug via INTRAVENOUS

## 2022-09-17 MED ORDER — TRAMADOL HCL 50 MG PO TABS
50.0000 mg | ORAL_TABLET | Freq: Four times a day (QID) | ORAL | Status: DC | PRN
  Administered 2022-09-17: 50 mg via ORAL
  Filled 2022-09-17: qty 1

## 2022-09-17 MED ORDER — ACETAMINOPHEN 10 MG/ML IV SOLN
INTRAVENOUS | Status: DC | PRN
  Administered 2022-09-17: 1000 mg via INTRAVENOUS

## 2022-09-17 MED ORDER — KETOROLAC TROMETHAMINE 15 MG/ML IJ SOLN
15.0000 mg | Freq: Once | INTRAMUSCULAR | Status: AC
  Administered 2022-09-17: 15 mg via INTRAVENOUS
  Filled 2022-09-17: qty 1

## 2022-09-17 MED ORDER — HYDRALAZINE HCL 20 MG/ML IJ SOLN: 10.0000 mg | INTRAMUSCULAR | Status: DC | PRN

## 2022-09-17 MED ORDER — PIPERACILLIN-TAZOBACTAM 3.375 G IVPB 30 MIN
3.3750 g | Freq: Once | INTRAVENOUS | Status: AC
  Administered 2022-09-17: 3.375 g via INTRAVENOUS
  Filled 2022-09-17: qty 50

## 2022-09-17 MED ORDER — OXYCODONE HCL 5 MG/5ML PO SOLN: 5.0000 mg | Freq: Once | ORAL | Status: DC | PRN

## 2022-09-17 MED ORDER — HYDROMORPHONE HCL 1 MG/ML IJ SOLN
INTRAMUSCULAR | Status: DC | PRN
  Administered 2022-09-17 (×2): .5 mg via INTRAVENOUS

## 2022-09-17 MED ORDER — MIDAZOLAM HCL 2 MG/2ML IJ SOLN
INTRAMUSCULAR | Status: DC | PRN
  Administered 2022-09-17: 2 mg via INTRAVENOUS

## 2022-09-17 MED ORDER — DIPHENHYDRAMINE HCL 50 MG/ML IJ SOLN: 12.5000 mg | Freq: Four times a day (QID) | INTRAMUSCULAR | Status: DC | PRN

## 2022-09-17 MED ORDER — SCOPOLAMINE 1 MG/3DAYS TD PT72
1.0000 | MEDICATED_PATCH | Freq: Once | TRANSDERMAL | Status: DC
  Administered 2022-09-17: 1.5 mg via TRANSDERMAL

## 2022-09-17 MED ORDER — ONDANSETRON HCL 4 MG/2ML IJ SOLN: 4.0000 mg | Freq: Four times a day (QID) | INTRAMUSCULAR | Status: DC | PRN

## 2022-09-17 MED ORDER — ROCURONIUM BROMIDE 10 MG/ML (PF) SYRINGE
PREFILLED_SYRINGE | INTRAVENOUS | Status: DC | PRN
  Administered 2022-09-17: 10 mg via INTRAVENOUS
  Administered 2022-09-17: 50 mg via INTRAVENOUS

## 2022-09-17 SURGICAL SUPPLY — 38 items
APPLIER CLIP 5 13 M/L LIGAMAX5 (MISCELLANEOUS) ×1
BAG COUNTER SPONGE SURGICOUNT (BAG) ×1 IMPLANT
CANISTER SUCT 3000ML PPV (MISCELLANEOUS) ×1 IMPLANT
CHLORAPREP W/TINT 26 (MISCELLANEOUS) ×1 IMPLANT
CLIP APPLIE 5 13 M/L LIGAMAX5 (MISCELLANEOUS) ×1 IMPLANT
CNTNR URN SCR LID CUP LEK RST (MISCELLANEOUS) ×1 IMPLANT
CONT SPEC 4OZ STRL OR WHT (MISCELLANEOUS) ×1
COVER SURGICAL LIGHT HANDLE (MISCELLANEOUS) ×1 IMPLANT
DERMABOND ADVANCED .7 DNX12 (GAUZE/BANDAGES/DRESSINGS) ×1 IMPLANT
ELECT REM PT RETURN 9FT ADLT (ELECTROSURGICAL) ×1
ELECTRODE REM PT RTRN 9FT ADLT (ELECTROSURGICAL) ×1 IMPLANT
GLOVE BIO SURGEON STRL SZ 6 (GLOVE) ×1 IMPLANT
GLOVE BIOGEL PI IND STRL 6.5 (GLOVE) IMPLANT
GLOVE ECLIPSE 6.0 STRL STRAW (GLOVE) IMPLANT
GLOVE INDICATOR 6.5 STRL GRN (GLOVE) ×1 IMPLANT
GOWN STRL REUS W/ TWL LRG LVL3 (GOWN DISPOSABLE) ×3 IMPLANT
GOWN STRL REUS W/TWL LRG LVL3 (GOWN DISPOSABLE) ×4
GRASPER SUT TROCAR 14GX15 (MISCELLANEOUS) ×1 IMPLANT
HEMOSTAT SNOW SURGICEL 2X4 (HEMOSTASIS) IMPLANT
IRRIG SUCT STRYKERFLOW 2 WTIP (MISCELLANEOUS) ×1
IRRIGATION SUCT STRKRFLW 2 WTP (MISCELLANEOUS) ×1 IMPLANT
KIT BASIN OR (CUSTOM PROCEDURE TRAY) ×1 IMPLANT
KIT TURNOVER KIT B (KITS) ×1 IMPLANT
NDL INSUFFLATION 14GA 120MM (NEEDLE) ×1 IMPLANT
NEEDLE INSUFFLATION 14GA 120MM (NEEDLE) ×1 IMPLANT
NS IRRIG 1000ML POUR BTL (IV SOLUTION) ×1 IMPLANT
PAD ARMBOARD 7.5X6 YLW CONV (MISCELLANEOUS) ×1 IMPLANT
SCISSORS LAP 5X35 DISP (ENDOMECHANICALS) ×1 IMPLANT
SET TUBE SMOKE EVAC HIGH FLOW (TUBING) ×1 IMPLANT
SLEEVE Z-THREAD 5X100MM (TROCAR) ×2 IMPLANT
SUT MNCRL AB 4-0 PS2 18 (SUTURE) ×1 IMPLANT
SYS BAG RETRIEVAL 10MM (BASKET) ×1
SYSTEM BAG RETRIEVAL 10MM (BASKET) ×1 IMPLANT
TOWEL GREEN STERILE (TOWEL DISPOSABLE) ×1 IMPLANT
TRAY LAPAROSCOPIC MC (CUSTOM PROCEDURE TRAY) ×1 IMPLANT
TROCAR 11X100 Z THREAD (TROCAR) ×1 IMPLANT
TROCAR Z-THREAD OPTICAL 5X100M (TROCAR) ×1 IMPLANT
WATER STERILE IRR 1000ML POUR (IV SOLUTION) ×1 IMPLANT

## 2022-09-17 NOTE — Progress Notes (Signed)
Patient ID: Jamie Holder, female   DOB: 19-Feb-1986, 37 y.o.   MRN: [id number]   OB/GYN Telephone Consult  09/17/2022   Jamie Holder is a 37 y.o. G2P2002 who is currently not pregnant presenting to [facility] ER.   I was called for a consult regarding the care of this patient by the Physician (MD/DO) caring for the patient.   The provider had the following clinical question: Lower abdominal pain acute onset. CT unremarkable and pelvic sono shows normal right ovary and absent left ovary. There is a question of ovarian torsion or PID.   The provider presented the following relevant clinical information: UPT negative, normal lipase, nml CMP. Mild elevated WBC. Normal temp. Elevated BP.  I performed a chart review on the patient and reviewed available documentation.  BP (!) 164/103   Pulse 83   Temp 98 F (36.7 C) (Oral)   Resp 19   SpO2 100%   Exam- performed by consulting provider   Recommendations:  -consider pelvic exam, to exclude PID -consider MRI, if we truly have to r/o torsion, though torsion usually presents with enlarged ovaries with edema and debris noted. -Unclear etiology of pain  -May refer for GYN services if she does not have this already    Thank you for this consult and if additional recommendations are needed please call [phone] for the OB/GYN attending on service at [facility].   I spent approximately 10 minutes directly consulting with the provider and verbally discussing this case. Additionally 10 minutes minutes was spent performing chart review and documentation.    Donnamae Jude, MD

## 2022-09-17 NOTE — ED Notes (Addendum)
Waiting for husband to return to transport pt. IV secured with coban.

## 2022-09-17 NOTE — ED Provider Notes (Signed)
[facility] Emergency Department Provider Note MRN:  [id number]  Arrival date & time: 09/17/22     Chief Complaint   Abdominal Pain, Nausea, and Emesis   History of Present Illness   Jamie Holder is a 37 y.o. year-old female with a history of anxiety presenting to the ED with chief complaint of nominal pain.  Severe abdominal pain starting 2 hours ago with nausea vomiting.  Right lower quadrant spreading across the lower abdomen.  Review of Systems  A thorough review of systems was obtained and all systems are negative except as noted in the HPI and PMH.   Patient's Health History    Past Medical History:  Diagnosis Date   Anxiety    GERD (gastroesophageal reflux disease) 2016    Past Surgical History:  Procedure Laterality Date   ABDOMINAL SURGERY     BREAST REDUCTION SURGERY  04/28/2021   COSMETIC SURGERY  04/2021   Breast reduction and abdominalplasty   ESOPHAGOSCOPY W/ BOTOX INJECTION     WISDOM TOOTH EXTRACTION      Family History  Problem Relation Age of Onset   Depression Mother    Hearing loss Mother    Hyperlipidemia Mother    Hypertension Mother    Thyroid cancer Mother    Anxiety disorder Mother    Hyperlipidemia Father    Hypertension Father    Depression Sister    Anxiety disorder Sister    Heart attack Maternal Grandmother    Heart attack Paternal Grandmother    Breast cancer Neg Hx    Colon cancer Neg Hx    Osteoporosis Neg Hx     Social History   Socioeconomic History   Marital status: Married    Spouse name: Not on file   Number of children: Not on file   Years of education: Not on file   Highest education level: Not on file  Occupational History   Not on file  Tobacco Use   Smoking status: Never   Smokeless tobacco: Never  Vaping Use   Vaping Use: Never used  Substance and Sexual Activity   Alcohol use: Yes    Alcohol/week: 3.0 standard drinks of alcohol    Types: 3 Standard drinks or equivalent per week    Drug use: Never   Sexual activity: Yes    Birth control/protection: Pill    Comment: Loestrin FE  Other Topics Concern   Not on file  Social History Narrative   39 year old and 39 year old    Married   Masters in Education officer, museum   Husband is Technical brewer   Social Determinants of Radio broadcast assistant Strain: Not on file  Food Insecurity: Not on file  Transportation Needs: Not on file  Physical Activity: Not on file  Stress: Not on file  Social Connections: Not on file  Intimate Partner Violence: Not on file     Physical Exam   Vitals:   09/17/22 0100 09/17/22 0200  BP: (!) 164/103 (!) 146/106  Pulse: 83 78  Resp: 19 18  Temp:    SpO2: 100% 98%    CONSTITUTIONAL: Well-appearing, moderate distress due to pain NEURO/PSYCH:  Alert and oriented x 3, no focal deficits EYES:  eyes equal and reactive ENT/NECK:  no LAD, no JVD CARDIO: Regular rate, well-perfused, normal S1 and S2 PULM:  CTAB no wheezing or rhonchi GI/GU:  non-distended, non-tender MSK/SPINE:  No gross deformities, no edema SKIN:  no rash, atraumatic   *Additional and/or pertinent  findings included in MDM below  Diagnostic and Interventional Summary    EKG Interpretation  Date/Time:    Ventricular Rate:    PR Interval:    QRS Duration:   QT Interval:    QTC Calculation:   R Axis:     Text Interpretation:         Labs Reviewed  COMPREHENSIVE METABOLIC PANEL - Abnormal; Notable for the following components:      Result Value   Glucose, Bld 127 (*)    AST 12 (*)    Total Bilirubin 0.2 (*)    All other components within normal limits  CBC - Abnormal; Notable for the following components:   WBC 11.4 (*)    All other components within normal limits  URINALYSIS, ROUTINE W REFLEX MICROSCOPIC - Abnormal; Notable for the following components:   Hgb urine dipstick TRACE (*)    Ketones, ur TRACE (*)    All other components within normal limits  LIPASE, BLOOD  PREGNANCY, URINE    US PELVIC  COMPLETE W TRANSVAGINAL AND TORSION R/O  Final Result  Addendum (preliminary) 1 of 1  ADDENDUM REPORT: 09/17/2022 02:12    ADDENDUM:  CLINICAL DATA:   Right upper quadrant pain.    EXAM:    ULTRASOUND ABDOMEN LIMITED RIGHT UPPER QUADRANT    COMPARISON:   None Available.    FINDINGS:    Gallbladder: A large amount of heterogeneous echogenic sludge,  containing numerous subcentimeter shadowing, echogenic gallstones,  is seen throughout the gallbladder lumen. The largest gallstone  measures approximately 8 mm. The gallbladder wall measures 2.7 mm in  thickness. A positive sonographic Murphy's sign is noted by  sonographer.    Common bile duct: Diameter: 3.0 mm    Liver: No focal lesion identified. Within normal limits in  parenchymal echogenicity. Portal vein is patent on color Doppler  imaging with normal direction of blood flow towards the liver.    Other: None.    IMPRESSION:    Cholelithiasis and gallbladder sludge in the setting of a positive  sonographic Murphy's sign, consistent with acute cholecystitis.      Electronically Signed    By: Virgina Norfolk M.D.    On: 09/17/2022 02:12      Final    US Abdomen Limited RUQ (LIVER/GB)  Final Result  Addendum (preliminary) 1 of 1  ADDENDUM REPORT: 09/17/2022 02:12    ADDENDUM:  CLINICAL DATA:   Right upper quadrant pain.    EXAM:    ULTRASOUND ABDOMEN LIMITED RIGHT UPPER QUADRANT    COMPARISON:   None Available.    FINDINGS:    Gallbladder: A large amount of heterogeneous echogenic sludge,  containing numerous subcentimeter shadowing, echogenic gallstones,  is seen throughout the gallbladder lumen. The largest gallstone  measures approximately 8 mm. The gallbladder wall measures 2.7 mm in  thickness. A positive sonographic Murphy's sign is noted by  sonographer.    Common bile duct: Diameter: 3.0 mm    Liver: No focal lesion identified. Within normal limits in  parenchymal echogenicity. Portal vein  is patent on color Doppler  imaging with normal direction of blood flow towards the liver.    Other: None.    IMPRESSION:    Cholelithiasis and gallbladder sludge in the setting of a positive  sonographic Murphy's sign, consistent with acute cholecystitis.      Electronically Signed    By: Virgina Norfolk M.D.    On: 09/17/2022 02:12      Final  CT ABDOMEN PELVIS W CONTRAST  Final Result      Medications  piperacillin-tazobactam (ZOSYN) IVPB 3.375 g (has no administration in time range)  lactated ringers infusion (has no administration in time range)  ondansetron (ZOFRAN) injection 4 mg (4 mg Intravenous Given 09/16/22 2332)  HYDROmorphone (DILAUDID) injection 1 mg (1 mg Intravenous Given 09/16/22 2332)  sodium chloride 0.9 % bolus 1,000 mL (0 mLs Intravenous Stopped 09/17/22 0223)  iohexol (OMNIPAQUE) 300 MG/ML solution 80 mL (80 mLs Intravenous Contrast Given 09/16/22 2345)  ondansetron (ZOFRAN) injection 4 mg (4 mg Intravenous Given 09/17/22 0008)  HYDROmorphone (DILAUDID) injection 1 mg (1 mg Intravenous Given 09/17/22 0008)  HYDROmorphone (DILAUDID) injection 1 mg (1 mg Intravenous Given 09/17/22 0057)  ketorolac (TORADOL) 15 MG/ML injection 15 mg (15 mg Intravenous Given 09/17/22 0056)     Procedures  /  Critical Care .Critical Care  Performed by: Maudie Flakes, MD Authorized by: Maudie Flakes, MD   Critical care provider statement:    Critical care time (minutes):  45   Critical care was necessary to treat or prevent imminent or life-threatening deterioration of the following conditions: Acute cholecystitis.   Critical care was time spent personally by me on the following activities:  Development of treatment plan with patient or surrogate, discussions with consultants, evaluation of patient's response to treatment, examination of patient, ordering and review of laboratory studies, ordering and review of radiographic studies, ordering and performing treatments and  interventions, pulse oximetry, re-evaluation of patient's condition and review of old charts   ED Course and Medical Decision Making  Initial Impression and Ddx Concern for kidney stone versus ovarian cyst/torsion versus appendicitis.  Awaiting labs, CT.  Past medical/surgical history that increases complexity of ED encounter: None  Interpretation of Diagnostics I personally reviewed the CT abdomen and my interpretation is as follows: Distended gallbladder but otherwise no acute process evident.  Labs reassuring with no significant blood count or electrolyte disturbance.  Ultrasound of the pelvis is overall reassuring.  Patient Reassessment and Ultimate Disposition/Management     I discussed the ultrasound of the pelvis with OB/GYN on-call given the lack of visibility of the left ovary, they explained that if torsion were present over usually enlarges and so torsion felt to be unlikely.  I was able to get an updated read on the right upper quadrant ultrasound and Dr. Sherrye Payor of radiology confirms that patient has acute cholecystitis.  Case discussed with Dr. Dema Severin of general surgery, plan is to send [facility] emergency department to be evaluated by the general surgeons to determine surgical plan.  Dr. Deno Etienne is the accepting ED provider at [facility].  Starting LR infusion, Zosyn.  Pain much better controlled at this time.  Patient management required discussion with the following services or consulting groups:  General/Trauma Surgery  Complexity of Problems Addressed Acute illness or injury that poses threat of life of bodily function  Additional Data Reviewed and Analyzed Further history obtained from: Further history from spouse/family member  Additional Factors Impacting ED Encounter Risk Use of parenteral controlled substances and Consideration of hospitalization  Barth Kirks. Sedonia Small, [place] [email or web address]  Final  Clinical Impressions(s) / ED Diagnoses     ICD-10-CM   1. Generalized abdominal pain  R10.84     2. Acute cholecystitis  K81.0       ED Discharge Orders     None        Discharge Instructions  Discussed with and Provided to Patient:   Discharge Instructions   None      Maudie Flakes, MD 09/17/22 [phone]

## 2022-09-17 NOTE — Anesthesia Postprocedure Evaluation (Signed)
Anesthesia Post Note  Patient: Jamie Holder  Procedure(s) Performed: LAPAROSCOPIC CHOLECYSTECTOMY (Abdomen)     Patient location during evaluation: PACU Anesthesia Type: General Level of consciousness: awake and alert Pain management: pain level controlled Vital Signs Assessment: post-procedure vital signs reviewed and stable Respiratory status: spontaneous breathing, nonlabored ventilation, respiratory function stable and patient connected to nasal cannula oxygen Cardiovascular status: blood pressure returned to baseline and stable Postop Assessment: no apparent nausea or vomiting Anesthetic complications: no   No notable events documented.  Last Vitals:  Vitals:   09/17/22 1453 09/17/22 1500  BP: (!) 150/88 (!) 140/84  Pulse: (!) 116 (!) 107  Resp: 16 11  Temp: 36.7 C   SpO2: 92% 96%    Last Pain:  Vitals:   09/17/22 1453  TempSrc:   PainSc: 0-No pain                 Herron Fero

## 2022-09-17 NOTE — Discharge Instructions (Signed)
[facility]  Please arrive at least 30 min before your appointment to complete your check in paperwork.  If you are unable to arrive 30 min prior to your appointment time we may have to cancel or reschedule you. LAPAROSCOPIC SURGERY: POST OP INSTRUCTIONS Always review your discharge instruction sheet given to you by the facility where your surgery was performed. IF YOU HAVE DISABILITY OR FAMILY LEAVE FORMS, YOU MUST BRING THEM TO THE OFFICE FOR PROCESSING.   DO NOT GIVE THEM TO YOUR DOCTOR.  PAIN CONTROL  First take acetaminophen (Tylenol) AND/or ibuprofen (Advil) to control your pain after surgery.  Follow directions on package.  Taking acetaminophen (Tylenol) and/or ibuprofen (Advil) regularly after surgery will help to control your pain and lower the amount of prescription pain medication you may need.  You should not take more than 4,000 mg (4 grams) of acetaminophen (Tylenol) in 24 hours.  You should not take ibuprofen (Advil), aleve, motrin, naprosyn or other NSAIDS if you have a history of stomach ulcers or chronic kidney disease.  A prescription for pain medication may be given to you upon discharge.  Take your pain medication as prescribed, if you still have uncontrolled pain after taking acetaminophen (Tylenol) or ibuprofen (Advil). Use ice packs to help control pain. If you need a refill on your pain medication, please contact your pharmacy.  They will contact our office to request authorization. Prescriptions will not be filled after 5pm or on week-ends.  HOME MEDICATIONS Take your usually prescribed medications unless otherwise directed.  DIET You should follow a light diet the first few days after arrival home.  Be sure to include lots of fluids daily. Avoid fatty, fried foods.   CONSTIPATION It is common to experience some constipation after surgery and if you are taking pain medication.  Increasing fluid intake and taking a stool softener (such as Colace)  will usually help or prevent this problem from occurring.  A mild laxative (Milk of Magnesia or Miralax) should be taken according to package instructions if there are no bowel movements after 48 hours.  WOUND/INCISION CARE Most patients will experience some swelling and bruising in the area of the incisions.  Ice packs will help.  Swelling and bruising can take several days to resolve.  Unless discharge instructions indicate otherwise, follow guidelines below  STERI-STRIPS - you may remove your outer bandages 48 hours after surgery, and you may shower at that time.  You have steri-strips (small skin tapes) in place directly over the incision.  These strips should be left on the skin for 7-10 days.   DERMABOND/SKIN GLUE - you may shower in 24 hours.  The glue will flake off over the next 2-3 weeks. Any sutures or staples will be removed at the office during your follow-up visit.  ACTIVITIES You may resume regular (light) daily activities beginning the next day--such as daily self-care, walking, climbing stairs--gradually increasing activities as tolerated.  You may have sexual intercourse when it is comfortable.  Refrain from any heavy lifting or straining until approved by your doctor. You may drive when you are no longer taking prescription pain medication, you can comfortably wear a seatbelt, and you can safely maneuver your car and apply brakes.  FOLLOW-UP You should see your doctor in the office for a follow-up appointment approximately 2-3 weeks after your surgery.  You should have been given your post-op/follow-up appointment when your surgery was scheduled.  If you did not receive a post-op/follow-up appointment, make sure   that you call for this appointment within a day or two after you arrive home to insure a convenient appointment time.   WHEN TO CALL YOUR DOCTOR: Fever over 101.85F Inability to urinate Continued bleeding from incision. Increased pain, redness, or drainage from the  incision. Increasing abdominal pain  The clinic staff is available to answer your questions during regular business hours.  Please don't hesitate to call and ask to speak to one of the nurses for clinical concerns.  If you have a medical emergency, go to the nearest emergency room or call 911.  A surgeon from [facility] is always on call at the hospital. [address] [place], [place], [place]  [address] ? [address], [place],    [id number] [phone] ? [phone] ? FAX [phone]

## 2022-09-17 NOTE — ED Provider Notes (Signed)
I received patient in transfer from [place].  Briefly 37 year old female with right-sided abdominal discomfort.  Right upper quadrant ultrasound concerning for acute cholecystitis.  Sent ED to ED for surgical evaluation.  Patient feels like her pain is under control at the moment.  Case discussed with general surgery will evaluate.   Deno Etienne, DO 09/17/22 [phone]

## 2022-09-17 NOTE — ED Notes (Signed)
ED TO INPATIENT HANDOFF REPORT  ED Nurse Name and Phone #: Iona Coach Name/Age/Gender Jamie Holder 37 y.o. female Room/Bed: 019C/019C  Code Status   Code Status: Not on file  Home/SNF/Other Home Patient oriented to: self, place, time, and situation Is this baseline? Yes   Triage Complete: Triage complete  Chief Complaint Acute cholecystitis [K81.0]  Triage Note Pt c/o lower back pain onset 2hrs ago, radiating into lower abd pain, associated NV but denies urinary symptoms. States she is down to bile after vomiting 4x today, unable to tolerate PO. Denies urinary complaint, GYN complaint.    Allergies No Known Allergies  Level of Care/Admitting Diagnosis ED Disposition     ED Disposition  Admit   Condition  --   Comment  Hospital Area: [facility] [facility] [[id number]]  Level of Care: Med-Surg [16]  May place patient in observation at [facility] or [place] if equivalent level of care is available:: No  Covid Evaluation: Asymptomatic - no recent exposure (last 10 days) testing not required  Diagnosis: Acute cholecystitis [575.0.ICD-9-CM]  Admitting Physician: [place], [place]  Attending Physician: CCS, MD [[id number]]          B Medical/Surgery History Past Medical History:  Diagnosis Date   Anxiety    GERD (gastroesophageal reflux disease) 2016   Past Surgical History:  Procedure Laterality Date   ABDOMINAL SURGERY     BREAST REDUCTION SURGERY  04/28/2021   COSMETIC SURGERY  04/2021   Breast reduction and abdominalplasty   ESOPHAGOSCOPY W/ BOTOX INJECTION     WISDOM TOOTH EXTRACTION       A IV Location/Drains/Wounds Patient Lines/Drains/Airways Status     Active Line/Drains/Airways     Name Placement date Placement time Site Days   Peripheral IV 09/16/22 20 G Right Antecubital 09/16/22  2329  Antecubital  1            Intake/Output Last 24 hours  Intake/Output Summary (Last 24 hours) at 09/17/2022 0720 Last data filed at 09/17/2022  0309 Gross per 24 hour  Intake 1045 ml  Output --  Net 1045 ml    Labs/Imaging Results for orders placed or performed during the hospital encounter of 09/16/22 (from the past 48 hour(s))  Lipase, blood     Status: None   Collection Time: 09/16/22 11:18 PM  Result Value Ref Range   Lipase 18 11 - 51 U/L    Comment: Performed at [facility], [place], [place], [place] [address]  Comprehensive metabolic panel     Status: Abnormal   Collection Time: 09/16/22 11:18 PM  Result Value Ref Range   Sodium 139 135 - 145 mmol/L   Potassium 3.8 3.5 - 5.1 mmol/L   Chloride 102 98 - 111 mmol/L   CO2 25 22 - 32 mmol/L   Glucose, Bld 127 (H) 70 - 99 mg/dL    Comment: Glucose reference range applies only to samples taken after fasting for at least 8 hours.   BUN 11 6 - 20 mg/dL   Creatinine, Ser 0.83 0.44 - 1.00 mg/dL   Calcium 10.1 8.9 - 10.3 mg/dL   Total Protein 7.9 6.5 - 8.1 g/dL   Albumin 4.6 3.5 - 5.0 g/dL   AST 12 (L) 15 - 41 U/L   ALT 12 0 - 44 U/L   Alkaline Phosphatase 61 38 - 126 U/L   Total Bilirubin 0.2 (L) 0.3 - 1.2 mg/dL   GFR, Estimated >60 >60 mL/min  Comment: (NOTE) Calculated using the CKD-EPI Creatinine Equation (2021)    Anion gap 12 5 - 15    Comment: Performed at [facility], [address], [place], [place] [address]  CBC     Status: Abnormal   Collection Time: 09/16/22 11:18 PM  Result Value Ref Range   WBC 11.4 (H) 4.0 - 10.5 K/uL   RBC 4.60 3.87 - 5.11 MIL/uL   Hemoglobin 12.9 12.0 - 15.0 g/dL   HCT 39.7 36.0 - 46.0 %   MCV 86.3 80.0 - 100.0 fL   MCH 28.0 26.0 - 34.0 pg   MCHC 32.5 30.0 - 36.0 g/dL   RDW 13.6 11.5 - 15.5 %   Platelets 359 150 - 400 K/uL   nRBC 0.0 0.0 - 0.2 %    Comment: Performed at [facility], [address], [place], [place] [address]  Urinalysis, Routine w reflex microscopic -Urine, Clean Catch     Status: Abnormal   Collection Time: 09/16/22 11:32 PM  Result  Value Ref Range   Color, Urine YELLOW YELLOW   APPearance CLEAR CLEAR   Specific Gravity, Urine 1.027 1.005 - 1.030   pH 5.5 5.0 - 8.0   Glucose, UA NEGATIVE NEGATIVE mg/dL   Hgb urine dipstick TRACE (A) NEGATIVE   Bilirubin Urine NEGATIVE NEGATIVE   Ketones, ur TRACE (A) NEGATIVE mg/dL   Protein, ur NEGATIVE NEGATIVE mg/dL   Nitrite NEGATIVE NEGATIVE   Leukocytes,Ua NEGATIVE NEGATIVE   RBC / HPF 0-5 0 - 5 RBC/hpf   WBC, UA 0-5 0 - 5 WBC/hpf   Bacteria, UA NONE SEEN NONE SEEN   Squamous Epithelial / HPF 0-5 0 - 5 /HPF   Mucus PRESENT     Comment: Performed at [facility], [address] [place], [place] [address]  Pregnancy, urine     Status: None   Collection Time: 09/16/22 11:32 PM  Result Value Ref Range   Preg Test, Ur NEGATIVE NEGATIVE    Comment:        THE SENSITIVITY OF THIS METHODOLOGY IS >20 mIU/mL. Performed at [facility], [address] [place], [place] [address]    US PELVIC COMPLETE W TRANSVAGINAL AND TORSION R/O  Addendum Date: 09/17/2022   ADDENDUM REPORT: 09/17/2022 02:12 ADDENDUM: CLINICAL DATA:   Right upper quadrant pain. EXAM: ULTRASOUND ABDOMEN LIMITED RIGHT UPPER QUADRANT COMPARISON:   None Available. FINDINGS: Gallbladder: A large amount of heterogeneous echogenic sludge, containing numerous subcentimeter shadowing, echogenic gallstones, is seen throughout the gallbladder lumen. The largest gallstone measures approximately 8 mm. The gallbladder wall measures 2.7 mm in thickness. A positive sonographic Murphy's sign is noted by sonographer. Common bile duct: Diameter: 3.0 mm Liver: No focal lesion identified. Within normal limits in parenchymal echogenicity. Portal vein is patent on color Doppler imaging with normal direction of blood flow towards the liver. Other: None. IMPRESSION: Cholelithiasis and gallbladder sludge in the setting of a positive sonographic Murphy's sign, consistent with acute cholecystitis.  Electronically Signed   By: Virgina Norfolk M.D.   On: 09/17/2022 02:12   Result Date: 09/17/2022 CLINICAL DATA:  Lower back pain and pelvic pain with nausea and vomiting. EXAM: TRANSABDOMINAL AND TRANSVAGINAL ULTRASOUND OF PELVIS DOPPLER ULTRASOUND OF OVARIES TECHNIQUE: Both transabdominal and transvaginal ultrasound examinations of the pelvis were performed. Transabdominal technique was performed for global imaging of the pelvis including uterus, ovaries, adnexal regions, and pelvic cul-de-sac. It was necessary to proceed with endovaginal exam following the transabdominal exam to visualize the  uterus, endometrium, bilateral ovaries and bilateral adnexa. Color and duplex Doppler ultrasound was utilized to evaluate blood flow to the ovaries. COMPARISON:  None Available. FINDINGS: Uterus Measurements: 7.4 cm x 3.5 cm x 4.5 cm = volume: 70.0 mL. A 1.0 cm x 0.8 cm x 1.0 cm heterogeneous uterine fibroid is seen within the posterior aspect of the uterine fundus on the right. Endometrium Thickness: 2.6 mm.  No focal abnormality visualized. Right ovary Measurements: 2.6 cm x 1.5 cm x 1.4 cm = volume: 2.9 mL. Normal appearance/no adnexal mass. Left ovary The left ovary is not visualized. Pulsed Doppler evaluation of the RIGHT ovary demonstrates normal low-resistance arterial and venous waveforms. Other findings No abnormal free fluid. IMPRESSION: 1. Small, heterogeneous uterine fibroid. 2. Nonvisualization of the LEFT ovary. 3. Normal ultrasonographic appearance of the RIGHT ovary with normal RIGHT ovarian flow. Electronically Signed: By: Virgina Norfolk M.D. On: 09/17/2022 01:33   US Abdomen Limited RUQ (LIVER/GB)  Addendum Date: 09/17/2022   ADDENDUM REPORT: 09/17/2022 02:12 ADDENDUM: CLINICAL DATA:   Right upper quadrant pain. EXAM: ULTRASOUND ABDOMEN LIMITED RIGHT UPPER QUADRANT COMPARISON:   None Available. FINDINGS: Gallbladder: A large amount of heterogeneous echogenic sludge, containing numerous  subcentimeter shadowing, echogenic gallstones, is seen throughout the gallbladder lumen. The largest gallstone measures approximately 8 mm. The gallbladder wall measures 2.7 mm in thickness. A positive sonographic Murphy's sign is noted by sonographer. Common bile duct: Diameter: 3.0 mm Liver: No focal lesion identified. Within normal limits in parenchymal echogenicity. Portal vein is patent on color Doppler imaging with normal direction of blood flow towards the liver. Other: None. IMPRESSION: Cholelithiasis and gallbladder sludge in the setting of a positive sonographic Murphy's sign, consistent with acute cholecystitis. Electronically Signed   By: Virgina Norfolk M.D.   On: 09/17/2022 02:12   Result Date: 09/17/2022 CLINICAL DATA:  Lower back pain and pelvic pain with nausea and vomiting. EXAM: TRANSABDOMINAL AND TRANSVAGINAL ULTRASOUND OF PELVIS DOPPLER ULTRASOUND OF OVARIES TECHNIQUE: Both transabdominal and transvaginal ultrasound examinations of the pelvis were performed. Transabdominal technique was performed for global imaging of the pelvis including uterus, ovaries, adnexal regions, and pelvic cul-de-sac. It was necessary to proceed with endovaginal exam following the transabdominal exam to visualize the uterus, endometrium, bilateral ovaries and bilateral adnexa. Color and duplex Doppler ultrasound was utilized to evaluate blood flow to the ovaries. COMPARISON:  None Available. FINDINGS: Uterus Measurements: 7.4 cm x 3.5 cm x 4.5 cm = volume: 70.0 mL. A 1.0 cm x 0.8 cm x 1.0 cm heterogeneous uterine fibroid is seen within the posterior aspect of the uterine fundus on the right. Endometrium Thickness: 2.6 mm.  No focal abnormality visualized. Right ovary Measurements: 2.6 cm x 1.5 cm x 1.4 cm = volume: 2.9 mL. Normal appearance/no adnexal mass. Left ovary The left ovary is not visualized. Pulsed Doppler evaluation of the RIGHT ovary demonstrates normal low-resistance arterial and venous waveforms.  Other findings No abnormal free fluid. IMPRESSION: 1. Small, heterogeneous uterine fibroid. 2. Nonvisualization of the LEFT ovary. 3. Normal ultrasonographic appearance of the RIGHT ovary with normal RIGHT ovarian flow. Electronically Signed: By: Virgina Norfolk M.D. On: 09/17/2022 01:33   CT ABDOMEN PELVIS W CONTRAST  Result Date: 09/17/2022 CLINICAL DATA:  Lower back pain radiating into the lower abdomen with nausea and vomiting. EXAM: CT ABDOMEN AND PELVIS WITH CONTRAST TECHNIQUE: Multidetector CT imaging of the abdomen and pelvis was performed using the standard protocol following bolus administration of intravenous contrast. RADIATION DOSE REDUCTION: This exam was performed  according to the departmental dose-optimization program which includes automated exposure control, adjustment of the mA and/or kV according to patient size and/or use of iterative reconstruction technique. CONTRAST:  73m OMNIPAQUE IOHEXOL 300 MG/ML  SOLN COMPARISON:  None Available. FINDINGS: Lower chest: No acute abnormality. Hepatobiliary: A 4 mm focus of parenchymal low attenuation is seen within the posterolateral aspect of the right lobe of the liver (axial CT image 35, CT series 2). A few 1 mm gallstones are seen within the dependent portion of a markedly distended gallbladder (best seen on coronal reformatted image 75, CT series 5/sagittal reformatted image 49, CT series 6). There is no evidence of gallbladder wall thickening, Peri coli cystic inflammation or biliary dilatation. Pancreas: Unremarkable. No pancreatic ductal dilatation or surrounding inflammatory changes. Spleen: Normal in size without focal abnormality. Adrenals/Urinary Tract: Adrenal glands are unremarkable. Kidneys are normal, without renal calculi, focal lesion, or hydronephrosis. The urinary bladder is empty and subsequently limited in evaluation. Stomach/Bowel: Stomach is within normal limits. Appendix appears normal. No evidence of bowel wall thickening,  distention, or inflammatory changes. Vascular/Lymphatic: No significant vascular findings are present. No enlarged abdominal or pelvic lymph nodes. Reproductive: Uterus and bilateral adnexa are unremarkable. Other: Multiple small surgical clips are seen along the anterior aspect of the lower abdominal wall and pelvic wall. A very small, fat containing umbilical hernia is noted. No abdominopelvic ascites. Musculoskeletal: Small chronic deformity is seen along the anterior aspect of the superior endplate of the L5 vertebral body. No acute osseous abnormalities are identified. IMPRESSION: 1. Cholelithiasis without evidence of acute cholecystitis. 2. Findings likely consistent with a small hepatic cyst versus hemangioma. Correlation with nonemergent hepatic ultrasound is recommended. 3. Very small, fat containing umbilical hernia. 4. Small chronic deformity along the anterior aspect of the superior endplate of the L5 vertebral body. Electronically Signed   By: TVirgina NorfolkM.D.   On: 09/17/2022 00:10    Pending Labs [employer](From admission, onward)     Start     Ordered   Signed and Held  HIV Antibody (routine testing w rflx)  (HIV Antibody (Routine testing w reflex) panel)  Once,   R        Signed and Held   Signed and Held  Creatinine, serum  (enoxaparin (LOVENOX)    CrCl >/= 30 ml/min)  Weekly,   R     Comments: while on enoxaparin therapy    Signed and Held            Vitals/Pain Today's Vitals   09/17/22 0630 09/17/22 0644 09/17/22 0645 09/17/22 0718  BP: (!) 147/116  (!) 142/99   Pulse: 82  89   Resp: 13  15   Temp:      TempSrc:      SpO2: 96%  99%   PainSc:  2   2     Isolation Precautions No active isolations  Medications Medications  lactated ringers infusion ( Intravenous Not Given 09/17/22 0243)  ondansetron (ZOFRAN) injection 4 mg (4 mg Intravenous Given 09/16/22 2332)  HYDROmorphone (DILAUDID) injection 1 mg (1 mg Intravenous Given 09/16/22 2332)  sodium  chloride 0.9 % bolus 1,000 mL (0 mLs Intravenous Stopped 09/17/22 0223)  iohexol (OMNIPAQUE) 300 MG/ML solution 80 mL (80 mLs Intravenous Contrast Given 09/16/22 2345)  ondansetron (ZOFRAN) injection 4 mg (4 mg Intravenous Given 09/17/22 0008)  HYDROmorphone (DILAUDID) injection 1 mg (1 mg Intravenous Given 09/17/22 0008)  HYDROmorphone (DILAUDID) injection 1 mg (1 mg Intravenous Given 09/17/22 0057)  ketorolac (TORADOL) 15 MG/ML injection 15 mg (15 mg Intravenous Given 09/17/22 0056)  piperacillin-tazobactam (ZOSYN) IVPB 3.375 g (0 g Intravenous Stopped 09/17/22 0309)  HYDROmorphone (DILAUDID) injection 1 mg (1 mg Intravenous Given 09/17/22 0444)  HYDROmorphone (DILAUDID) injection 1 mg (1 mg Intravenous Given 09/17/22 0615)    Mobility walks     Focused Assessments     R Recommendations: See Admitting Provider Note  Report given to:   Additional Notes:

## 2022-09-17 NOTE — H&P (Signed)
CC: Right sided abdominal pain  HPI: Jamie Holder is an 37 y.o. female with hx of GERD presented to [facility] with Right sided abdominal pain that began at 8:30 pm on 09/16/22. Never had this before. Pain is described as sharp and in both Right upper and mid abdomen as indicated by where she points with her finger. No aggravating/alleviating factors aside from pain medication helping.   She underwent workup in the ER at [facility] and had findings concerning for acute cholecystitis so was transferred here for further evaluation. She denies n/v.  She is here with her husband whom is one of our critical care medicine physicians  Past Medical History:  Diagnosis Date   Anxiety    GERD (gastroesophageal reflux disease) 2016    Past Surgical History:  Procedure Laterality Date   ABDOMINAL SURGERY     BREAST REDUCTION SURGERY  04/28/2021   COSMETIC SURGERY  04/2021   Breast reduction and abdominalplasty   ESOPHAGOSCOPY W/ BOTOX INJECTION     WISDOM TOOTH EXTRACTION      Family History  Problem Relation Age of Onset   Depression Mother    Hearing loss Mother    Hyperlipidemia Mother    Hypertension Mother    Thyroid cancer Mother    Anxiety disorder Mother    Hyperlipidemia Father    Hypertension Father    Depression Sister    Anxiety disorder Sister    Heart attack Maternal Grandmother    Heart attack Paternal Grandmother    Breast cancer Neg Hx    Colon cancer Neg Hx    Osteoporosis Neg Hx     Social:  reports that she has never smoked. She has never used smokeless tobacco. She reports current alcohol use of about 3.0 standard drinks of alcohol per week. She reports that she does not use drugs.  Allergies: No Known Allergies  Medications: I have reviewed the patient's current medications.  Results for orders placed or performed during the hospital encounter of 09/16/22 (from the past 48 hour(s))  Lipase, blood     Status: None   Collection Time: 09/16/22 11:18 PM   Result Value Ref Range   Lipase 18 11 - 51 U/L    Comment: Performed at [facility], [address], [place], [place] [address]  Comprehensive metabolic panel     Status: Abnormal   Collection Time: 09/16/22 11:18 PM  Result Value Ref Range   Sodium 139 135 - 145 mmol/L   Potassium 3.8 3.5 - 5.1 mmol/L   Chloride 102 98 - 111 mmol/L   CO2 25 22 - 32 mmol/L   Glucose, Bld 127 (H) 70 - 99 mg/dL    Comment: Glucose reference range applies only to samples taken after fasting for at least 8 hours.   BUN 11 6 - 20 mg/dL   Creatinine, Ser 0.83 0.44 - 1.00 mg/dL   Calcium 10.1 8.9 - 10.3 mg/dL   Total Protein 7.9 6.5 - 8.1 g/dL   Albumin 4.6 3.5 - 5.0 g/dL   AST 12 (L) 15 - 41 U/L   ALT 12 0 - 44 U/L   Alkaline Phosphatase 61 38 - 126 U/L   Total Bilirubin 0.2 (L) 0.3 - 1.2 mg/dL   GFR, Estimated >60 >60 mL/min    Comment: (NOTE) Calculated using the CKD-EPI Creatinine Equation (2021)    Anion gap 12 5 - 15    Comment: Performed at [facility], [place], [place]  [id number]  CBC     Status: Abnormal   Collection Time: 09/16/22 11:18 PM  Result Value Ref Range   WBC 11.4 (H) 4.0 - 10.5 K/uL   RBC 4.60 3.87 - 5.11 MIL/uL   Hemoglobin 12.9 12.0 - 15.0 g/dL   HCT 39.7 36.0 - 46.0 %   MCV 86.3 80.0 - 100.0 fL   MCH 28.0 26.0 - 34.0 pg   MCHC 32.5 30.0 - 36.0 g/dL   RDW 13.6 11.5 - 15.5 %   Platelets 359 150 - 400 K/uL   nRBC 0.0 0.0 - 0.2 %    Comment: Performed at [facility], [place], [place] [address]  Urinalysis, Routine w reflex microscopic -Urine, Clean Catch     Status: Abnormal   Collection Time: 09/16/22 11:32 PM  Result Value Ref Range   Color, Urine YELLOW YELLOW   APPearance CLEAR CLEAR   Specific Gravity, Urine 1.027 1.005 - 1.030   pH 5.5 5.0 - 8.0   Glucose, UA NEGATIVE NEGATIVE mg/dL   Hgb urine dipstick TRACE (A) NEGATIVE   Bilirubin Urine NEGATIVE NEGATIVE   Ketones,  ur TRACE (A) NEGATIVE mg/dL   Protein, ur NEGATIVE NEGATIVE mg/dL   Nitrite NEGATIVE NEGATIVE   Leukocytes,Ua NEGATIVE NEGATIVE   RBC / HPF 0-5 0 - 5 RBC/hpf   WBC, UA 0-5 0 - 5 WBC/hpf   Bacteria, UA NONE SEEN NONE SEEN   Squamous Epithelial / HPF 0-5 0 - 5 /HPF   Mucus PRESENT     Comment: Performed at [facility], [address], [place], [place] [address]  Pregnancy, urine     Status: None   Collection Time: 09/16/22 11:32 PM  Result Value Ref Range   Preg Test, Ur NEGATIVE NEGATIVE    Comment:        THE SENSITIVITY OF THIS METHODOLOGY IS >20 mIU/mL. Performed at [facility], [address], [place], [place] [address]     US PELVIC COMPLETE W TRANSVAGINAL AND TORSION R/O  Addendum Date: 09/17/2022   ADDENDUM REPORT: 09/17/2022 02:12 ADDENDUM: CLINICAL DATA:   Right upper quadrant pain. EXAM: ULTRASOUND ABDOMEN LIMITED RIGHT UPPER QUADRANT COMPARISON:   None Available. FINDINGS: Gallbladder: A large amount of heterogeneous echogenic sludge, containing numerous subcentimeter shadowing, echogenic gallstones, is seen throughout the gallbladder lumen. The largest gallstone measures approximately 8 mm. The gallbladder wall measures 2.7 mm in thickness. A positive sonographic Murphy's sign is noted by sonographer. Common bile duct: Diameter: 3.0 mm Liver: No focal lesion identified. Within normal limits in parenchymal echogenicity. Portal vein is patent on color Doppler imaging with normal direction of blood flow towards the liver. Other: None. IMPRESSION: Cholelithiasis and gallbladder sludge in the setting of a positive sonographic Murphy's sign, consistent with acute cholecystitis. Electronically Signed   By: Virgina Norfolk M.D.   On: 09/17/2022 02:12   Result Date: 09/17/2022 CLINICAL DATA:  Lower back pain and pelvic pain with nausea and vomiting. EXAM: TRANSABDOMINAL AND TRANSVAGINAL ULTRASOUND OF PELVIS DOPPLER ULTRASOUND OF OVARIES  TECHNIQUE: Both transabdominal and transvaginal ultrasound examinations of the pelvis were performed. Transabdominal technique was performed for global imaging of the pelvis including uterus, ovaries, adnexal regions, and pelvic cul-de-sac. It was necessary to proceed with endovaginal exam following the transabdominal exam to visualize the uterus, endometrium, bilateral ovaries and bilateral adnexa. Color and duplex Doppler ultrasound was utilized to evaluate blood flow to the ovaries. COMPARISON:  None Available. FINDINGS: Uterus Measurements: 7.4 cm x 3.5  cm x 4.5 cm = volume: 70.0 mL. A 1.0 cm x 0.8 cm x 1.0 cm heterogeneous uterine fibroid is seen within the posterior aspect of the uterine fundus on the right. Endometrium Thickness: 2.6 mm.  No focal abnormality visualized. Right ovary Measurements: 2.6 cm x 1.5 cm x 1.4 cm = volume: 2.9 mL. Normal appearance/no adnexal mass. Left ovary The left ovary is not visualized. Pulsed Doppler evaluation of the RIGHT ovary demonstrates normal low-resistance arterial and venous waveforms. Other findings No abnormal free fluid. IMPRESSION: 1. Small, heterogeneous uterine fibroid. 2. Nonvisualization of the LEFT ovary. 3. Normal ultrasonographic appearance of the RIGHT ovary with normal RIGHT ovarian flow. Electronically Signed: By: Virgina Norfolk M.D. On: 09/17/2022 01:33   US Abdomen Limited RUQ (LIVER/GB)  Addendum Date: 09/17/2022   ADDENDUM REPORT: 09/17/2022 02:12 ADDENDUM: CLINICAL DATA:   Right upper quadrant pain. EXAM: ULTRASOUND ABDOMEN LIMITED RIGHT UPPER QUADRANT COMPARISON:   None Available. FINDINGS: Gallbladder: A large amount of heterogeneous echogenic sludge, containing numerous subcentimeter shadowing, echogenic gallstones, is seen throughout the gallbladder lumen. The largest gallstone measures approximately 8 mm. The gallbladder wall measures 2.7 mm in thickness. A positive sonographic Murphy's sign is noted by sonographer. Common bile duct:  Diameter: 3.0 mm Liver: No focal lesion identified. Within normal limits in parenchymal echogenicity. Portal vein is patent on color Doppler imaging with normal direction of blood flow towards the liver. Other: None. IMPRESSION: Cholelithiasis and gallbladder sludge in the setting of a positive sonographic Murphy's sign, consistent with acute cholecystitis. Electronically Signed   By: Virgina Norfolk M.D.   On: 09/17/2022 02:12   Result Date: 09/17/2022 CLINICAL DATA:  Lower back pain and pelvic pain with nausea and vomiting. EXAM: TRANSABDOMINAL AND TRANSVAGINAL ULTRASOUND OF PELVIS DOPPLER ULTRASOUND OF OVARIES TECHNIQUE: Both transabdominal and transvaginal ultrasound examinations of the pelvis were performed. Transabdominal technique was performed for global imaging of the pelvis including uterus, ovaries, adnexal regions, and pelvic cul-de-sac. It was necessary to proceed with endovaginal exam following the transabdominal exam to visualize the uterus, endometrium, bilateral ovaries and bilateral adnexa. Color and duplex Doppler ultrasound was utilized to evaluate blood flow to the ovaries. COMPARISON:  None Available. FINDINGS: Uterus Measurements: 7.4 cm x 3.5 cm x 4.5 cm = volume: 70.0 mL. A 1.0 cm x 0.8 cm x 1.0 cm heterogeneous uterine fibroid is seen within the posterior aspect of the uterine fundus on the right. Endometrium Thickness: 2.6 mm.  No focal abnormality visualized. Right ovary Measurements: 2.6 cm x 1.5 cm x 1.4 cm = volume: 2.9 mL. Normal appearance/no adnexal mass. Left ovary The left ovary is not visualized. Pulsed Doppler evaluation of the RIGHT ovary demonstrates normal low-resistance arterial and venous waveforms. Other findings No abnormal free fluid. IMPRESSION: 1. Small, heterogeneous uterine fibroid. 2. Nonvisualization of the LEFT ovary. 3. Normal ultrasonographic appearance of the RIGHT ovary with normal RIGHT ovarian flow. Electronically Signed: By: Virgina Norfolk M.D. On:  09/17/2022 01:33   CT ABDOMEN PELVIS W CONTRAST  Result Date: 09/17/2022 CLINICAL DATA:  Lower back pain radiating into the lower abdomen with nausea and vomiting. EXAM: CT ABDOMEN AND PELVIS WITH CONTRAST TECHNIQUE: Multidetector CT imaging of the abdomen and pelvis was performed using the standard protocol following bolus administration of intravenous contrast. RADIATION DOSE REDUCTION: This exam was performed according to the departmental dose-optimization program which includes automated exposure control, adjustment of the mA and/or kV according to patient size and/or use of iterative reconstruction technique. CONTRAST:  50m OMNIPAQUE IOHEXOL  300 MG/ML  SOLN COMPARISON:  None Available. FINDINGS: Lower chest: No acute abnormality. Hepatobiliary: A 4 mm focus of parenchymal low attenuation is seen within the posterolateral aspect of the right lobe of the liver (axial CT image 35, CT series 2). A few 1 mm gallstones are seen within the dependent portion of a markedly distended gallbladder (best seen on coronal reformatted image 75, CT series 5/sagittal reformatted image 49, CT series 6). There is no evidence of gallbladder wall thickening, Peri coli cystic inflammation or biliary dilatation. Pancreas: Unremarkable. No pancreatic ductal dilatation or surrounding inflammatory changes. Spleen: Normal in size without focal abnormality. Adrenals/Urinary Tract: Adrenal glands are unremarkable. Kidneys are normal, without renal calculi, focal lesion, or hydronephrosis. The urinary bladder is empty and subsequently limited in evaluation. Stomach/Bowel: Stomach is within normal limits. Appendix appears normal. No evidence of bowel wall thickening, distention, or inflammatory changes. Vascular/Lymphatic: No significant vascular findings are present. No enlarged abdominal or pelvic lymph nodes. Reproductive: Uterus and bilateral adnexa are unremarkable. Other: Multiple small surgical clips are seen along the anterior  aspect of the lower abdominal wall and pelvic wall. A very small, fat containing umbilical hernia is noted. No abdominopelvic ascites. Musculoskeletal: Small chronic deformity is seen along the anterior aspect of the superior endplate of the L5 vertebral body. No acute osseous abnormalities are identified. IMPRESSION: 1. Cholelithiasis without evidence of acute cholecystitis. 2. Findings likely consistent with a small hepatic cyst versus hemangioma. Correlation with nonemergent hepatic ultrasound is recommended. 3. Very small, fat containing umbilical hernia. 4. Small chronic deformity along the anterior aspect of the superior endplate of the L5 vertebral body. Electronically Signed   By: Virgina Norfolk M.D.   On: 09/17/2022 00:10    ROS - all of the below systems have been reviewed with the patient and positives are indicated with bold text General: chills, fever or night sweats Eyes: blurry vision or double vision ENT: epistaxis or sore throat Allergy/Immunology: itchy/watery eyes or nasal congestion Hematologic/Lymphatic: bleeding problems, blood clots or swollen lymph nodes Endocrine: temperature intolerance or unexpected weight changes Breast: new or changing breast lumps or nipple discharge Resp: cough, shortness of breath, or wheezing CV: chest pain or dyspnea on exertion GI: as per HPI GU: dysuria, trouble voiding, or hematuria MSK: joint pain or joint stiffness Neuro: TIA or stroke symptoms Derm: pruritus and skin lesion changes Psych: anxiety and depression  PE Blood pressure (!) 143/94, pulse 100, temperature 97.9 F (36.6 C), temperature source Oral, resp. rate 17, SpO2 100 %. Constitutional: NAD; conversant Eyes: Moist conjunctiva;  anicteric Lungs: Normal respiratory effort CV: RRR GI: Abd soft, mild RUQ tenderness to palpation; no rebound/guarding; nondistended; no tenderness elsewhere MSK: Normal range of motion of extremities Psychiatric: Appropriate affect  Results  for orders placed or performed during the hospital encounter of 09/16/22 (from the past 48 hour(s))  Lipase, blood     Status: None   Collection Time: 09/16/22 11:18 PM  Result Value Ref Range   Lipase 18 11 - 51 U/L    Comment: Performed at [facility], [address] [place], [place] [address]  Comprehensive metabolic panel     Status: Abnormal   Collection Time: 09/16/22 11:18 PM  Result Value Ref Range   Sodium 139 135 - 145 mmol/L   Potassium 3.8 3.5 - 5.1 mmol/L   Chloride 102 98 - 111 mmol/L   CO2 25 22 - 32 mmol/L   Glucose, Bld 127 (H) 70 - 99 mg/dL  Comment: Glucose reference range applies only to samples taken after fasting for at least 8 hours.   BUN 11 6 - 20 mg/dL   Creatinine, Ser 0.83 0.44 - 1.00 mg/dL   Calcium 10.1 8.9 - 10.3 mg/dL   Total Protein 7.9 6.5 - 8.1 g/dL   Albumin 4.6 3.5 - 5.0 g/dL   AST 12 (L) 15 - 41 U/L   ALT 12 0 - 44 U/L   Alkaline Phosphatase 61 38 - 126 U/L   Total Bilirubin 0.2 (L) 0.3 - 1.2 mg/dL   GFR, Estimated >60 >60 mL/min    Comment: (NOTE) Calculated using the CKD-EPI Creatinine Equation (2021)    Anion gap 12 5 - 15    Comment: Performed at [facility], [address] [place], [place] [address]  CBC     Status: Abnormal   Collection Time: 09/16/22 11:18 PM  Result Value Ref Range   WBC 11.4 (H) 4.0 - 10.5 K/uL   RBC 4.60 3.87 - 5.11 MIL/uL   Hemoglobin 12.9 12.0 - 15.0 g/dL   HCT 39.7 36.0 - 46.0 %   MCV 86.3 80.0 - 100.0 fL   MCH 28.0 26.0 - 34.0 pg   MCHC 32.5 30.0 - 36.0 g/dL   RDW 13.6 11.5 - 15.5 %   Platelets 359 150 - 400 K/uL   nRBC 0.0 0.0 - 0.2 %    Comment: Performed at [facility], [place], [place] [address]  Urinalysis, Routine w reflex microscopic -Urine, Clean Catch     Status: Abnormal   Collection Time: 09/16/22 11:32 PM  Result Value Ref Range   Color, Urine YELLOW YELLOW   APPearance CLEAR CLEAR   Specific Gravity, Urine  1.027 1.005 - 1.030   pH 5.5 5.0 - 8.0   Glucose, UA NEGATIVE NEGATIVE mg/dL   Hgb urine dipstick TRACE (A) NEGATIVE   Bilirubin Urine NEGATIVE NEGATIVE   Ketones, ur TRACE (A) NEGATIVE mg/dL   Protein, ur NEGATIVE NEGATIVE mg/dL   Nitrite NEGATIVE NEGATIVE   Leukocytes,Ua NEGATIVE NEGATIVE   RBC / HPF 0-5 0 - 5 RBC/hpf   WBC, UA 0-5 0 - 5 WBC/hpf   Bacteria, UA NONE SEEN NONE SEEN   Squamous Epithelial / HPF 0-5 0 - 5 /HPF   Mucus PRESENT     Comment: Performed at [facility], [address], [place], [place] [address]  Pregnancy, urine     Status: None   Collection Time: 09/16/22 11:32 PM  Result Value Ref Range   Preg Test, Ur NEGATIVE NEGATIVE    Comment:        THE SENSITIVITY OF THIS METHODOLOGY IS >20 mIU/mL. Performed at [facility], [address] [place], [place] [address]     US PELVIC COMPLETE W TRANSVAGINAL AND TORSION R/O  Addendum Date: 09/17/2022   ADDENDUM REPORT: 09/17/2022 02:12 ADDENDUM: CLINICAL DATA:   Right upper quadrant pain. EXAM: ULTRASOUND ABDOMEN LIMITED RIGHT UPPER QUADRANT COMPARISON:   None Available. FINDINGS: Gallbladder: A large amount of heterogeneous echogenic sludge, containing numerous subcentimeter shadowing, echogenic gallstones, is seen throughout the gallbladder lumen. The largest gallstone measures approximately 8 mm. The gallbladder wall measures 2.7 mm in thickness. A positive sonographic Murphy's sign is noted by sonographer. Common bile duct: Diameter: 3.0 mm Liver: No focal lesion identified. Within normal limits in parenchymal echogenicity. Portal vein is patent on color Doppler imaging with normal direction of blood flow towards the liver. Other: None. IMPRESSION: Cholelithiasis  and gallbladder sludge in the setting of a positive sonographic Murphy's sign, consistent with acute cholecystitis. Electronically Signed   By: Virgina Norfolk M.D.   On: 09/17/2022 02:12   Result Date:  09/17/2022 CLINICAL DATA:  Lower back pain and pelvic pain with nausea and vomiting. EXAM: TRANSABDOMINAL AND TRANSVAGINAL ULTRASOUND OF PELVIS DOPPLER ULTRASOUND OF OVARIES TECHNIQUE: Both transabdominal and transvaginal ultrasound examinations of the pelvis were performed. Transabdominal technique was performed for global imaging of the pelvis including uterus, ovaries, adnexal regions, and pelvic cul-de-sac. It was necessary to proceed with endovaginal exam following the transabdominal exam to visualize the uterus, endometrium, bilateral ovaries and bilateral adnexa. Color and duplex Doppler ultrasound was utilized to evaluate blood flow to the ovaries. COMPARISON:  None Available. FINDINGS: Uterus Measurements: 7.4 cm x 3.5 cm x 4.5 cm = volume: 70.0 mL. A 1.0 cm x 0.8 cm x 1.0 cm heterogeneous uterine fibroid is seen within the posterior aspect of the uterine fundus on the right. Endometrium Thickness: 2.6 mm.  No focal abnormality visualized. Right ovary Measurements: 2.6 cm x 1.5 cm x 1.4 cm = volume: 2.9 mL. Normal appearance/no adnexal mass. Left ovary The left ovary is not visualized. Pulsed Doppler evaluation of the RIGHT ovary demonstrates normal low-resistance arterial and venous waveforms. Other findings No abnormal free fluid. IMPRESSION: 1. Small, heterogeneous uterine fibroid. 2. Nonvisualization of the LEFT ovary. 3. Normal ultrasonographic appearance of the RIGHT ovary with normal RIGHT ovarian flow. Electronically Signed: By: Virgina Norfolk M.D. On: 09/17/2022 01:33   US Abdomen Limited RUQ (LIVER/GB)  Addendum Date: 09/17/2022   ADDENDUM REPORT: 09/17/2022 02:12 ADDENDUM: CLINICAL DATA:   Right upper quadrant pain. EXAM: ULTRASOUND ABDOMEN LIMITED RIGHT UPPER QUADRANT COMPARISON:   None Available. FINDINGS: Gallbladder: A large amount of heterogeneous echogenic sludge, containing numerous subcentimeter shadowing, echogenic gallstones, is seen throughout the gallbladder lumen. The largest  gallstone measures approximately 8 mm. The gallbladder wall measures 2.7 mm in thickness. A positive sonographic Murphy's sign is noted by sonographer. Common bile duct: Diameter: 3.0 mm Liver: No focal lesion identified. Within normal limits in parenchymal echogenicity. Portal vein is patent on color Doppler imaging with normal direction of blood flow towards the liver. Other: None. IMPRESSION: Cholelithiasis and gallbladder sludge in the setting of a positive sonographic Murphy's sign, consistent with acute cholecystitis. Electronically Signed   By: Virgina Norfolk M.D.   On: 09/17/2022 02:12   Result Date: 09/17/2022 CLINICAL DATA:  Lower back pain and pelvic pain with nausea and vomiting. EXAM: TRANSABDOMINAL AND TRANSVAGINAL ULTRASOUND OF PELVIS DOPPLER ULTRASOUND OF OVARIES TECHNIQUE: Both transabdominal and transvaginal ultrasound examinations of the pelvis were performed. Transabdominal technique was performed for global imaging of the pelvis including uterus, ovaries, adnexal regions, and pelvic cul-de-sac. It was necessary to proceed with endovaginal exam following the transabdominal exam to visualize the uterus, endometrium, bilateral ovaries and bilateral adnexa. Color and duplex Doppler ultrasound was utilized to evaluate blood flow to the ovaries. COMPARISON:  None Available. FINDINGS: Uterus Measurements: 7.4 cm x 3.5 cm x 4.5 cm = volume: 70.0 mL. A 1.0 cm x 0.8 cm x 1.0 cm heterogeneous uterine fibroid is seen within the posterior aspect of the uterine fundus on the right. Endometrium Thickness: 2.6 mm.  No focal abnormality visualized. Right ovary Measurements: 2.6 cm x 1.5 cm x 1.4 cm = volume: 2.9 mL. Normal appearance/no adnexal mass. Left ovary The left ovary is not visualized. Pulsed Doppler evaluation of the RIGHT ovary demonstrates normal low-resistance arterial and  venous waveforms. Other findings No abnormal free fluid. IMPRESSION: 1. Small, heterogeneous uterine fibroid. 2.  Nonvisualization of the LEFT ovary. 3. Normal ultrasonographic appearance of the RIGHT ovary with normal RIGHT ovarian flow. Electronically Signed: By: Virgina Norfolk M.D. On: 09/17/2022 01:33   CT ABDOMEN PELVIS W CONTRAST  Result Date: 09/17/2022 CLINICAL DATA:  Lower back pain radiating into the lower abdomen with nausea and vomiting. EXAM: CT ABDOMEN AND PELVIS WITH CONTRAST TECHNIQUE: Multidetector CT imaging of the abdomen and pelvis was performed using the standard protocol following bolus administration of intravenous contrast. RADIATION DOSE REDUCTION: This exam was performed according to the departmental dose-optimization program which includes automated exposure control, adjustment of the mA and/or kV according to patient size and/or use of iterative reconstruction technique. CONTRAST:  38m OMNIPAQUE IOHEXOL 300 MG/ML  SOLN COMPARISON:  None Available. FINDINGS: Lower chest: No acute abnormality. Hepatobiliary: A 4 mm focus of parenchymal low attenuation is seen within the posterolateral aspect of the right lobe of the liver (axial CT image 35, CT series 2). A few 1 mm gallstones are seen within the dependent portion of a markedly distended gallbladder (best seen on coronal reformatted image 75, CT series 5/sagittal reformatted image 49, CT series 6). There is no evidence of gallbladder wall thickening, Peri coli cystic inflammation or biliary dilatation. Pancreas: Unremarkable. No pancreatic ductal dilatation or surrounding inflammatory changes. Spleen: Normal in size without focal abnormality. Adrenals/Urinary Tract: Adrenal glands are unremarkable. Kidneys are normal, without renal calculi, focal lesion, or hydronephrosis. The urinary bladder is empty and subsequently limited in evaluation. Stomach/Bowel: Stomach is within normal limits. Appendix appears normal. No evidence of bowel wall thickening, distention, or inflammatory changes. Vascular/Lymphatic: No significant vascular findings are  present. No enlarged abdominal or pelvic lymph nodes. Reproductive: Uterus and bilateral adnexa are unremarkable. Other: Multiple small surgical clips are seen along the anterior aspect of the lower abdominal wall and pelvic wall. A very small, fat containing umbilical hernia is noted. No abdominopelvic ascites. Musculoskeletal: Small chronic deformity is seen along the anterior aspect of the superior endplate of the L5 vertebral body. No acute osseous abnormalities are identified. IMPRESSION: 1. Cholelithiasis without evidence of acute cholecystitis. 2. Findings likely consistent with a small hepatic cyst versus hemangioma. Correlation with nonemergent hepatic ultrasound is recommended. 3. Very small, fat containing umbilical hernia. 4. Small chronic deformity along the anterior aspect of the superior endplate of the L5 vertebral body. Electronically Signed   By: TVirgina NorfolkM.D.   On: 09/17/2022 00:10    A/P: ETensley Gradillais an 37y.o. female with suspected acute cholecystitis  -Admit to med/surg for observation -NPO, MIVF -IV abx -The anatomy and physiology of the hepatobiliary system was discussed with her and her husband. The pathophysiology of gallbladder disease was discussed as well -We reviewed her labs and imaging findings with her. We discussed the somewhat equivocal findings with regards to appearance of her gallbladder although presence of RUQ pain, gallstones, WBC 11.4, tenderness on exam would be consistent with symptomatic cholelithiasis vs early acute cholecystitis  -The options for treatment were discussed including ongoing observation/PO challenge vs surgery - laparoscopic cholecystectomy -The planned procedure, material risks (including, but not limited to, pain, bleeding, infection, scarring, need for blood transfusion, damage to surrounding structures- blood vessels/nerves/viscus/organs, damage to bile duct, bile leak, need for additional procedures, hernia, worsening of  pre-existing medical conditions, pancreatitis, pneumonia, heart attack, stroke, death) benefits and alternatives to surgery were discussed at length. I noted a good probability  that the procedure would help improve their symptoms. The patient's questions were answered to her satisfaction, she voiced understanding and they elected to proceed with surgery. Additionally, we discussed typical postoperative expectations and the recovery process.  Discussed that this surgery would be carried out by one of my partners, Dr. Kae Heller, whom she would meet prior as well  I spent a total of 55 minutes in both face-to-face and non-face-to-face activities, excluding procedures performed, for this visit on the date of this encounter.  Nadeen Landau, [facility], [place]

## 2022-09-17 NOTE — Progress Notes (Signed)
Patient has arrived to the floor. Vitals stable pain 2/10.

## 2022-09-17 NOTE — Transfer of Care (Signed)
Immediate Anesthesia Transfer of Care Note  Patient: Jamie Holder  Procedure(s) Performed: LAPAROSCOPIC CHOLECYSTECTOMY (Abdomen)  Patient Location: PACU  Anesthesia Type:General  Level of Consciousness: drowsy  Airway & Oxygen Therapy: Patient Spontanous Breathing  Post-op Assessment: Report given to RN and Post -op Vital signs reviewed and stable  Post vital signs: Reviewed and stable  Last Vitals:  Vitals Value Taken Time  BP    Temp    Pulse 119 09/17/22 1455  Resp 17 09/17/22 1455  SpO2 92 % 09/17/22 1455  Vitals shown include unvalidated device data.  Last Pain:  Vitals:   09/17/22 1239  TempSrc: Oral  PainSc: 8          Complications: No notable events documented.

## 2022-09-17 NOTE — ED Notes (Signed)
Called report to Jamie Holder @ [facility].

## 2022-09-17 NOTE — Interval H&P Note (Signed)
History and Physical Interval Note:  09/17/2022 12:59 PM  Jamie Holder  has presented today for surgery, with the diagnosis of cholecystitis.  The various methods of treatment have been discussed with the patient and family. After consideration of risks, benefits and other options for treatment, the patient has consented to  Procedure(s): LAPAROSCOPIC CHOLECYSTECTOMY WITH ICG (N/A) as a surgical intervention.  The patient's history has been reviewed, patient examined, no change in status, stable for surgery.  I have reviewed the patient's chart and labs.  Questions were answered to the patient's satisfaction.     [name]

## 2022-09-17 NOTE — Op Note (Signed)
Operative Note  Jamie Holder 37 y.o. female [id number]  09/17/2022  Surgeon: Clovis Riley MD FACS  Assistant: Obie Dredge, PA-C  Procedure performed: Laparoscopic Cholecystectomy  Procedure classification: emergent  Preop diagnosis: Acute cholecystitis Post-op diagnosis/intraop findings: Gangrenous cholecystitis  Specimens: gallbladder  Retained items: none  EBL: minimal  Complications: none  Description of procedure: After obtaining informed consent the patient was brought to the operating room. Antibiotics were administered. SCD's were applied. General endotracheal anesthesia was initiated and a formal time-out was performed. The abdomen was prepped and draped in the usual sterile fashion and the abdomen was entered using  a left subcostal Veress needle  after instilling the site with local. Insufflation to 10mHg was obtained, 534mtrocar and camera inserted using optical entry in the right upper quadrant, and gross inspection revealed no evidence of injury from our entry. Two 30m23mrocars were introduced in the infraumbilical and right anterior axillary lines under direct visualization and following infiltration with local. An 11m54mocar was placed in the epigastrium.  The gallbladder was tensely distended and completely gangrenous with black and green discoloration of the wall.  This was decompressed with a Nezhat in order to allow it to be grasped and retracted.  The gallbladder fundus was then able to be grasped and retracted cephalad.  Omental adhesions to the gallbladder body were taken down bluntly and with cautery, carefully protecting the nearby duodenum.  There was intense inflammatory hepatocystic triangle.  A combination of hook electrocautery and blunt dissection was utilized to clear the peritoneum from the neck and cystic duct, circumferentially isolating the cystic artery and cystic duct and lifting the gallbladder from the cystic plate.  Only thin, transparent  strands of tissue were taken and this dissection was somewhat tedious.  Ultimately, the critical view of safety was achieved with the cystic artery, cystic duct, and liver bed visualized between them with no other structures. The artery was clipped with a single clip proximally and distally and divided as was the cystic duct with three clips on the proximal end. The gallbladder was dissected from the liver plate using electrocautery. Once freed the gallbladder was placed in an endocatch bag and removed intact through the epigastric trocar site, which did have to be extended slightly. A small amount of bleeding on the liver bed was controlled with cautery and surgicel snow. Some bile had been spilled from the gallbladder at the puncture site from the [facility], in addition to which there was bilious ascites present in the right upper quadrant at the beginning of the case.. This was all aspirated and the right upper quadrant was irrigated with warm sterile saline; the effluent was clear. Hemostasis was once again confirmed, and reinspection of the abdomen revealed no injuries. The clips were well opposed without any bile leak from the duct or the liver bed. The 11mm38mcar site in the epigastrium was closed with 2 simple interrupted 0 vicryls in the fascia under direct visualization using a PMI device. The abdomen was desufflated and all trocars removed. The skin incisions were closed with subcuticular 4-0 monocryl and Dermabond. The patient was awakened, extubated and transported to the recovery room in stable condition.    All counts were correct at the completion of the case.

## 2022-09-17 NOTE — Anesthesia Preprocedure Evaluation (Addendum)
Anesthesia Evaluation  Patient identified by MRN, date of birth, ID band Patient awake    Reviewed: Allergy & Precautions, H&P , NPO status , Patient's Chart, lab work & pertinent test results  History of Anesthesia Complications (+) PONV and history of anesthetic complications  Airway Mallampati: I  TM Distance: >3 FB Neck ROM: Full    Dental no notable dental hx. (+) Teeth Intact, Dental Advisory Given   Pulmonary neg pulmonary ROS   Pulmonary exam normal breath sounds clear to auscultation       Cardiovascular Exercise Tolerance: Good negative cardio ROS Normal cardiovascular exam Rhythm:Regular Rate:Normal     Neuro/Psych   Anxiety     negative neurological ROS  negative psych ROS   GI/Hepatic negative GI ROS, Neg liver ROS,,,  Endo/Other  negative endocrine ROS    Renal/GU negative Renal ROS  negative genitourinary   Musculoskeletal negative musculoskeletal ROS (+)    Abdominal   Peds negative pediatric ROS (+)  Hematology negative hematology ROS (+)   Anesthesia Other Findings   Reproductive/Obstetrics negative OB ROS                             Anesthesia Physical Anesthesia Plan  ASA: 2 and emergent  Anesthesia Plan: General   Post-op Pain Management: Toradol IV (intra-op)* and Ofirmev IV (intra-op)*   Induction: Intravenous and Cricoid pressure planned  PONV Risk Score and Plan: 3 and Ondansetron and Dexamethasone  Airway Management Planned: Oral ETT  Additional Equipment: None  Intra-op Plan:   Post-operative Plan: Extubation in OR  Informed Consent: I have reviewed the patients History and Physical, chart, labs and discussed the procedure including the risks, benefits and alternatives for the proposed anesthesia with the patient or authorized representative who has indicated his/her understanding and acceptance.       Plan Discussed with:  Anesthesiologist  Anesthesia Plan Comments: (  )        Anesthesia Quick Evaluation

## 2022-09-17 NOTE — ED Notes (Signed)
Pt arrived to room 19.

## 2022-09-17 NOTE — Plan of Care (Signed)

## 2022-09-17 NOTE — Anesthesia Procedure Notes (Signed)
Procedure Name: Intubation Date/Time: 09/17/2022 1:12 PM  Performed by: Carolan Clines, CRNAPre-anesthesia Checklist: Patient identified, Emergency Drugs available, Suction available and Patient being monitored Patient Re-evaluated:Patient Re-evaluated prior to induction Oxygen Delivery Method: Circle System Utilized Preoxygenation: Pre-oxygenation with 100% oxygen Induction Type: IV induction and Cricoid Pressure applied Ventilation: Mask ventilation without difficulty Laryngoscope Size: Mac and 4 Grade View: Grade I Tube type: Oral Tube size: 7.0 mm Number of attempts: 1 Airway Equipment and Method: Stylet Placement Confirmation: ETT inserted through vocal cords under direct vision, positive ETCO2 and breath sounds checked- equal and bilateral Secured at: 21 cm Tube secured with: Tape Dental Injury: Teeth and Oropharynx as per pre-operative assessment

## 2022-09-17 NOTE — ED Notes (Signed)
ED Provider at bedside. 

## 2022-09-18 ENCOUNTER — Other Ambulatory Visit (HOSPITAL_COMMUNITY): Payer: Self-pay

## 2022-09-18 ENCOUNTER — Encounter (HOSPITAL_COMMUNITY): Payer: Self-pay | Admitting: Surgery

## 2022-09-18 LAB — SURGICAL PATHOLOGY

## 2022-09-18 MED ORDER — DOCUSATE SODIUM 100 MG PO CAPS
200.0000 mg | ORAL_CAPSULE | Freq: Two times a day (BID) | ORAL | 0 refills | Status: DC
  Filled 2022-09-18: qty 10, 3d supply, fill #0

## 2022-09-18 MED ORDER — IBUPROFEN 600 MG PO TABS
600.0000 mg | ORAL_TABLET | Freq: Three times a day (TID) | ORAL | 0 refills | Status: DC
  Filled 2022-09-18: qty 30, 10d supply, fill #0

## 2022-09-18 MED ORDER — ACETAMINOPHEN 500 MG PO TABS
1000.0000 mg | ORAL_TABLET | Freq: Four times a day (QID) | ORAL | 0 refills | Status: DC
  Filled 2022-09-18: qty 30, 4d supply, fill #0

## 2022-09-18 MED ORDER — TRAMADOL HCL 50 MG PO TABS
50.0000 mg | ORAL_TABLET | Freq: Four times a day (QID) | ORAL | 0 refills | Status: AC | PRN
  Filled 2022-09-18: qty 20, 3d supply, fill #0

## 2022-09-18 MED ORDER — IBUPROFEN 200 MG PO TABS: 600.0000 mg | ORAL_TABLET | Freq: Three times a day (TID) | ORAL | Status: DC

## 2022-09-18 MED ORDER — TRAMADOL HCL 50 MG PO TABS
50.0000 mg | ORAL_TABLET | Freq: Four times a day (QID) | ORAL | 0 refills | Status: DC | PRN
  Filled 2022-09-18: qty 20, 3d supply, fill #0

## 2022-09-18 MED ORDER — TRAMADOL HCL 50 MG PO TABS
50.0000 mg | ORAL_TABLET | Freq: Four times a day (QID) | ORAL | Status: DC | PRN
  Administered 2022-09-18: 100 mg via ORAL
  Filled 2022-09-18: qty 2

## 2022-09-18 NOTE — TOC Transition Note (Signed)
Transition of Care [facility]) - CM/SW Discharge Note   Patient Details  Name: Jamie Holder MRN: [id number] Date of Birth: 12/18/1985  Transition of Care [facility]) CM/SW Contact:  Curlene Labrum, RN Phone Number: 09/18/2022, 10:36 AM   Clinical Narrative:     Transition of Care [facility]) Screening Note   Patient Details  Name: Jamie Holder Date of Birth: 03-01-1986   Transition of Care [facility]) CM/SW Contact:    Curlene Labrum, RN Phone Number: 09/18/2022, 10:37 AM    Transition of Care Department [facility]) has reviewed patient and no TOC needs have been identified at this time.   We will continue to monitor patient advancement through interdisciplinary progression rounds. If new patient transition needs arise, please place a TOC consult.           Patient Goals and CMS Choice      Discharge Placement                         Discharge Plan and Services Additional resources added to the After Visit Summary for                                       Social Determinants of Health (SDOH) Interventions SDOH Screenings   Food Insecurity: No Food Insecurity (09/17/2022)  Housing: Low Risk  (09/17/2022)  Transportation Needs: No Transportation Needs (09/17/2022)  Utilities: Not At Risk (09/17/2022)  Depression (PHQ2-9): Low Risk  (05/08/2022)  Tobacco Use: Low Risk  (09/18/2022)     Readmission Risk Interventions     No data to display

## 2022-09-19 ENCOUNTER — Telehealth: Payer: Self-pay

## 2022-09-19 NOTE — Transitions of Care (Post Inpatient/ED Visit) (Signed)
   09/19/2022  Name: Jamie Holder MRN: [id number] DOB: 02-Nov-1985  Today's TOC FU Call Status: Today's TOC FU Call Status:: Unsuccessul Call (1st Attempt) Unsuccessful Call (1st Attempt) Date: 09/19/22  Attempted to reach the patient regarding the most recent Inpatient/ED visit.  Follow Up Plan: Additional outreach attempts will be made to reach the patient to complete the Transitions of Care (Post Inpatient/ED visit) call.   [place] LPN [place] Advisor Direct Dial [phone]

## 2022-09-23 NOTE — Transitions of Care (Post Inpatient/ED Visit) (Signed)
   09/23/2022  Name: Jamie Holder MRN: [id number] DOB: 10-Mar-1986  Today's TOC FU Call Status: Today's TOC FU Call Status:: Successful TOC FU Call Competed Unsuccessful Call (1st Attempt) Date: 09/19/22 [facility] FU Call Complete Date: 09/23/22  Transition Care Management Follow-up Telephone Call Date of Discharge: 09/18/22 Discharge Facility: [facility]) Type of Discharge: Inpatient Admission Primary Inpatient Discharge Diagnosis:: Acute cholecystitis How have you been since you were released from the hospital?: Better Any questions or concerns?: No  Items Reviewed: Did you receive and understand the discharge instructions provided?: No Medications obtained and verified?: Yes (Medications Reviewed) Any new allergies since your discharge?: No Dietary orders reviewed?: Yes Do you have support at home?: Yes  Home Care and Equipment/Supplies: [place] Ordered?: No Any new equipment or medical supplies ordered?: No  Functional Questionnaire: Do you need assistance with bathing/showering or dressing?: No Do you need assistance with meal preparation?: No Do you need assistance with eating?: No Do you have difficulty maintaining continence: No Do you need assistance with getting out of bed/getting out of a chair/moving?: No Do you have difficulty managing or taking your medications?: No  Folllow up appointments reviewed: PCP Follow-up appointment confirmed?: No (specialist) [facility] Follow-up appointment confirmed?: Yes Date of Specialist follow-up appointment?: 10/10/22 Follow-Up Specialty Provider:: Puja Maczis PA-C Do you need transportation to your follow-up appointment?: No Do you understand care options if your condition(s) worsen?: Yes-patient verbalized understanding    [place] LPN [place] Direct Dial [phone]

## 2022-09-24 NOTE — Discharge Summary (Signed)
[facility] Discharge Summary   Patient ID: Macaylah Bater MRN: [id number] DOB/AGE: 39-Jan-1987 39 y.o.  Admit date: 09/16/2022 Discharge date: 09/18/2022  Admitting Diagnosis: Cholecystitis   Discharge Diagnosis Patient Active Problem List   Diagnosis Date Noted   Acute cholecystitis 09/17/2022   Anxiety 11/13/2020   Elevated blood pressure reading 02/04/2020   Gastroesophageal reflux disease 02/04/2020   Obesity 02/04/2020    Consultants None   Imaging: No results found.  Procedures Dr. Romana Juniper (09/17/21) - laparoscopic cholecystectomy    Hospital Course:  37 y/o F who presented to [facility] with cc abdominal pain.  Workup showed cholecystitis .  Patient was admitted and underwent procedure listed above.  Tolerated procedure well and was transferred to the floor.  Diet was advanced as tolerated.  On POD#1, the patient was voiding well, tolerating diet, ambulating well, pain well controlled, vital signs stable, incisions c/d/i and felt stable for discharge home.  Patient will follow up in our office as below.  I have personally reviewed the patients medication history on the [place] controlled substance database.  Physical Exam: General:  Alert, NAD, pleasant, comfortable Abd:  Soft, ND, mild tenderness, incisions C/D/I  Allergies as of 09/18/2022   No Known Allergies      Medication List     TAKE these medications    acetaminophen 500 MG tablet Commonly known as: TYLENOL Take 2 tablets (1,000 mg total) by mouth every 6 (six) hours.   cetirizine 10 MG chewable tablet Commonly known as: ZYRTEC Chew 10 mg by mouth daily.   docusate sodium 100 MG capsule Commonly known as: COLACE Take 2 capsules (200 mg total) by mouth 2 (two) times daily.   FLUoxetine 20 MG capsule Commonly known as: PROzac Take 1 capsule (20 mg total) by mouth daily.   FLUoxetine 40 MG capsule Commonly known as: PROZAC Take 1 capsule (40 mg total) by mouth  daily.   ibuprofen 600 MG tablet Commonly known as: ADVIL Take 1 tablet (600 mg total) by mouth 3 (three) times daily.   Incassia 0.35 MG tablet Generic drug: norethindrone Take 1 tablet (0.35 mg total) by mouth daily.   montelukast 10 MG tablet Commonly known as: SINGULAIR Take 1 tablet (10 mg total) by mouth at bedtime.   omeprazole 20 MG capsule Commonly known as: PRILOSEC Take 1 capsule (20 mg total) by mouth daily.   spironolactone 50 MG tablet Commonly known as: Aldactone Take 1 tablet (50 mg total) by mouth 2 (two) times daily.   traMADol 50 MG tablet Commonly known as: ULTRAM Take 1-2 tablets (50-100 mg total) by mouth every 6 (six) hours as needed for up to 7 days (pain not relieved by tylenol or advil).          Follow-up Information     Maczis, Carlena Hurl, [place]. Call.   Specialty: General Surgery Why: We are making a follow up appointment for you., Please call to confirm appointment time., Arrive 30 minutes early to complete check in, and bring photo ID and insurance card. Contact information: [place] [id number] [phone]                 Signed: Obie Dredge, [facility] 09/24/2022, 3:43 PM

## 2022-09-25 ENCOUNTER — Ambulatory Visit (INDEPENDENT_AMBULATORY_CARE_PROVIDER_SITE_OTHER): Payer: 59 | Admitting: Behavioral Health

## 2022-09-25 DIAGNOSIS — F418 Other specified anxiety disorders: Secondary | ICD-10-CM | POA: Diagnosis not present

## 2022-09-25 NOTE — Progress Notes (Signed)
                Malikhi Ogan L Ahmarion Saraceno, LMFT 

## 2022-09-25 NOTE — Progress Notes (Signed)
[place] Counselor/Therapist Progress Note  Patient ID: Shaili Parks, MRN: [id number],    Date: 09/25/2022  Time Spent: 5 min In Person @ [facility]   Treatment Type: Individual Therapy  Reported Symptoms: Reduction in anx/dep & stress @ home. Pt had emergent gall bladder resection last Monday & has been re-cooperating since. Her Mother has been present to help & leaves this wknd. The Family is planning a Ski Trip to [place] in a few wks & Pt is excited.  Pt has recently handled a sensitive issue w/her 39yo Dtr Clyde Canterbury. She felt the situation went well & they were able to talk tgthr to her Dtr's best health.  Mental Status Exam: Appearance:  Casual     Behavior: Appropriate and Sharing  Motor: Normal.  Speech/Language:  Clear and Coherent  Affect: Appropriate  Mood: normal  Thought process: normal  Thought content:   WNL  Sensory/Perceptual disturbances:   WNL  Orientation: oriented to person, place, and time/date  Attention: Good  Concentration: Good  Memory: WNL  Fund of knowledge:  Good  Insight:   Good  Judgment:  Good  Impulse Control: Good   Risk Assessment: Danger to Self:  No Self-injurious Behavior: No Danger to Others: No Duty to Warn:no Physical Aggression / Violence:No  Access to Firearms a concern: No  Gang Involvement:No   Subjective: Pt is upbeat today, although she has recently undergone emergent gall bladder removal. There has been adequate support w/the Family. Pt has explored her options for [employer] Work @ [employer] & requests Clinician provide a Reference. Pt reviewed her handling of sensitive issue w/her Dtr.   Interventions: Family Systems and Interpersonal  Diagnosis:Depression with anxiety  Plan: Lamija has agreed to move out her psychotherapy appts to once every 3 wks w/the understanding she can call if a need arises.  Saraiyah is taking charge of her self-care intentionally & will cont to find ways to meet her  personal needs & the needs of her marital relationship.  Target Date: 10/24/2022  Progress: 7  Frequency: Once every 3 wks  Modality: [name], LMFT

## 2022-10-07 ENCOUNTER — Other Ambulatory Visit: Payer: Self-pay

## 2022-10-14 ENCOUNTER — Ambulatory Visit (INDEPENDENT_AMBULATORY_CARE_PROVIDER_SITE_OTHER): Payer: 59 | Admitting: Behavioral Health

## 2022-10-14 DIAGNOSIS — F418 Other specified anxiety disorders: Secondary | ICD-10-CM

## 2022-10-14 NOTE — Progress Notes (Signed)
[name] Counselor/Therapist Progress Note  Patient ID: Jamie Holder, MRN: [id number],    Date: 10/14/2022  Time Spent: 34 min Caregility video; Pt is home in private & Provider is working remotely from [employer]   Treatment Type: Individual Therapy  Reported Symptoms: Elevated anx/dep & stressors w/in the Family Syst  Mental Status Exam: Appearance:  Casual     Behavior: Appropriate and Sharing  Motor: Normal  Speech/Language:  Clear and Coherent  Affect: Appropriate  Mood: normal  Thought process: normal  Thought content:   WNL  Sensory/Perceptual disturbances:   WNL  Orientation: oriented to person, place, and time/date  Attention: Good  Concentration: Good  Memory: WNL  Fund of knowledge:  Good  Insight:   Good  Judgment:  Good  Impulse Control: Good   Risk Assessment: Danger to Self:  No Self-injurious Behavior: No Danger to Others: No Duty to Warn:no Physical Aggression / Violence:No  Access to Firearms a concern: No  Gang Involvement:No   Subjective: Pt is stressed over the past vacay trip which was 4hrs fly time + 3hrs drive time.   Interventions: Family Systems  Diagnosis:Depression with anxiety  Plan: Jamie Holder is confused over the time w/her Family taking vacation in [place]. She viewed the time as prepared & organized. She felt her [name] came away from the trip w/little relaxation time. She will accept the fact she may not like to ski. She will write about it as necessary.  Target Date: 11/14/2022  Progress: 5  Frequency: Twice monthly   Modality: [name], LMFT

## 2022-10-14 NOTE — Progress Notes (Signed)
                Diem Pagnotta L Kyndel Egger, LMFT 

## 2022-10-18 ENCOUNTER — Telehealth: Payer: Self-pay | Admitting: Pulmonary Disease

## 2022-10-18 MED ORDER — ONDANSETRON 4 MG PO TBDP: 4.0000 mg | ORAL_TABLET | Freq: Three times a day (TID) | ORAL | 2 refills | Status: DC | PRN

## 2022-10-18 NOTE — Telephone Encounter (Signed)
PCCM:  Called by patient. Has picked up gastrointestinal virus from children at home who have been stick. Vomiting all night and unable to keep liquids down. Requesting zofran prescription.   Orders placed for zofran.   She will let her PCP or me know if the symptoms persist. Patient encouraged to monitor fluid intake.   Meds ordered this encounter  Medications   ondansetron (ZOFRAN-ODT) 4 MG disintegrating tablet    Sig: Take 1 tablet (4 mg total) by mouth every 8 (eight) hours as needed for nausea or vomiting.    Dispense:  20 tablet    Refill:  2    Garner Nash, DO [facility] 10/18/2022 7:52 AM

## 2022-11-01 ENCOUNTER — Other Ambulatory Visit: Payer: Self-pay

## 2022-11-12 ENCOUNTER — Ambulatory Visit (INDEPENDENT_AMBULATORY_CARE_PROVIDER_SITE_OTHER): Payer: 59 | Admitting: Behavioral Health

## 2022-11-12 DIAGNOSIS — F418 Other specified anxiety disorders: Secondary | ICD-10-CM

## 2022-11-12 NOTE — Progress Notes (Signed)
[facility] Counselor/Therapist Progress Note  Patient ID: Jamie Holder, MRN: [id number],    Date: 11/12/2022  Time Spent: 55 min Caregility video; Pt is home on her porch in private & Provider is remote from Home Office   Treatment Type: Individual Therapy  Reported Symptoms: Reduced anx/dep & stress due to Family dynamics as they ease in Family dvpmt  Mental Status Exam: Appearance:  Casual     Behavior: Appropriate and Sharing  Motor: Normal  Speech/Language:  Clear and Coherent  Affect: Appropriate  Mood: normal  Thought process: normal  Thought content:   WNL  Sensory/Perceptual disturbances:   WNL  Orientation: oriented to person, place, and time/date  Attention: Good  Concentration: Good  Memory: WNL  Fund of knowledge:  Good  Insight:   Good  Judgment:  Good  Impulse Control: Good   Risk Assessment: Danger to Self:  No Self-injurious Behavior: No Danger to Others: No Duty to Warn:no Physical Aggression / Violence:No  Access to Firearms a concern: No  Gang Involvement:No   Subjective: Pt is expecting the Summer to be entertaining for the kids & several trips to make their friendship deeper. Pt appreciates the long hours of their [name] careers & how to support ea other.    Interventions: Solution-Oriented/Positive Psychology  Diagnosis:Depression with anxiety  Plan: Jamie Holder will read the book, 'Emotional Intelligence' by Kristen Cardinal.   Target Date: 12/11/2022  Progress: 5  Frequency: Once every 3-4 wks  Modality: [name], LMFT

## 2022-11-12 NOTE — Progress Notes (Signed)
                Jamie Holder Jamie Bennette, LMFT 

## 2022-12-04 ENCOUNTER — Other Ambulatory Visit (HOSPITAL_COMMUNITY): Payer: Self-pay

## 2022-12-05 ENCOUNTER — Ambulatory Visit (INDEPENDENT_AMBULATORY_CARE_PROVIDER_SITE_OTHER): Payer: 59 | Admitting: Behavioral Health

## 2022-12-05 DIAGNOSIS — F418 Other specified anxiety disorders: Secondary | ICD-10-CM

## 2022-12-05 NOTE — Progress Notes (Signed)
                Prince Couey L Harrietta Incorvaia, LMFT 

## 2022-12-05 NOTE — Progress Notes (Signed)
[facility] Counselor/Therapist Progress Note  Patient ID: Jamie Holder, MRN: [id number],    Date: 12/05/2022  Time Spent: 55 min Caregility video; Pt is in her car in private & Provider working remotely from [employer]   Treatment Type: Individual Therapy  Reported Symptoms: Reduction in anx/dep & stress since Pt has found her personal authority @ home w/her children & [name]  Mental Status Exam: Appearance:  Casual     Behavior: Appropriate and Sharing  Motor: Normal  Speech/Language:  Clear and Coherent  Affect: Appropriate  Mood: normal  Thought process: normal  Thought content:   WNL  Sensory/Perceptual disturbances:   WNL  Orientation: oriented to person, place, and time/date  Attention: Good  Concentration: Good  Memory: WNL  Fund of knowledge:  Good  Insight:   Good  Judgment:  Good  Impulse Control: Good   Risk Assessment: Danger to Self:  No Self-injurious Behavior: No Danger to Others: No Duty to Warn:no Physical Aggression / Violence:No  Access to Firearms a concern: No  Gang Involvement:No   Subjective: Pt has put a Chore Chart into effect w/her children. It is working well. 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 Pt & [name] have the wknd to themselves. She is making plans for herself & will do some special things for her [name] as well.    Interventions: Family Systems  Diagnosis:Depression with anxiety  Plan: Jamie Holder has changed her Rx regime by taking her Prozac in the morning. It is really helping her to regulate this s/e profile. Her children are helping her & her initiation of the Chore Chart has really helped their self-esteem. She will cont to place her self-care priorities in perspective & promote her own well-being.  Target Date: 01/26/2023  Progress: 7  Frequency: Once every 3-4 wks  Modality: Jamie Fraise, LMFT

## 2022-12-16 NOTE — Addendum Note (Signed)
Addended by: Deneise Lever on: 12/16/2022 11:04 AM   Modules accepted: Level of Service

## 2023-01-01 ENCOUNTER — Other Ambulatory Visit: Payer: Self-pay

## 2023-01-07 ENCOUNTER — Ambulatory Visit (INDEPENDENT_AMBULATORY_CARE_PROVIDER_SITE_OTHER): Payer: 59 | Admitting: Behavioral Health

## 2023-01-07 DIAGNOSIS — F418 Other specified anxiety disorders: Secondary | ICD-10-CM

## 2023-01-07 NOTE — Progress Notes (Signed)
[facility] Counselor/Therapist Progress Note  Patient ID: Bular Hickok, MRN: [id number],    Date: 01/07/2023  Time Spent: 55 min Caregility video; Pt is home in private & Provider is working remoted from [facility]   Treatment Type: Individual Therapy  Reported Symptoms: Cont'd reduction in anx/dep & stress due to her efforts w/the children. She is sad today due to her disappointment over the lack of connection & companionship w/her [employer] last wk on their trip to [place], [place].  Mental Status Exam: Appearance:  Casual     Behavior: Appropriate and Sharing  Motor: Normal  Speech/Language:  Clear and Coherent  Affect: Appropriate  Mood: normal; a bit solemn today  Thought process: normal  Thought content:   WNL  Sensory/Perceptual disturbances:   WNL  Orientation: oriented to person, place, and time/date  Attention: Good  Concentration: Good  Memory: WNL  Fund of knowledge:  Good  Insight:   Good  Judgment:  Good  Impulse Control: Good   Risk Assessment: Danger to Self:  No Self-injurious Behavior: No Danger to Others: No Duty to Warn:no Physical Aggression / Violence:No  Access to Firearms a concern: No  Gang Involvement:No   Subjective: Pt cont's to implement her Chore Chart @ home for [employer]. Children are responding to tasks & helping to keep the home running. She recently went on trip to [place], [place] w/[name] & is saddened the past few days that she & [name] did not have enough time to connect w/ea other. Another Cpl was also on the trip.  This Summer, Pt is taking advantage of her [name] days off to care more for herself.  Interventions: Insight-Oriented and Family Systems  Diagnosis:Depression with anxiety  Plan: Jacqulyn will cont to implement self-care practices that help rejuvenate herself. She is interested in taking a class either in [employer] or a cooking class. She will explore where she can find these classes  further.  Dilcia will also create some special moments for her & [name] Dan. She is wanting him to do the same for her. She will plan a special, random celebration for them both & then possibly send him a Note in the mail to "Tag, your it!" To encourage his own plans for them in return.  Target Date: 02/10/2023  Progress: 3  Frequency: Once every 3-4 wks  Modality: Yarielis Funaro will plan to ask Jesusita Oka to attend the next session.  Target Date: 02/10/2023  Progress: 5  Frequency: Once every 3-4 wks  Modality: [name], LMFT

## 2023-01-07 NOTE — Progress Notes (Signed)
                Raeley Gilmore L Sache Sane, LMFT 

## 2023-01-31 ENCOUNTER — Other Ambulatory Visit: Payer: Self-pay | Admitting: Physician Assistant

## 2023-02-01 ENCOUNTER — Other Ambulatory Visit (HOSPITAL_COMMUNITY): Payer: Self-pay

## 2023-02-03 ENCOUNTER — Other Ambulatory Visit (HOSPITAL_COMMUNITY): Payer: Self-pay

## 2023-02-03 ENCOUNTER — Other Ambulatory Visit: Payer: Self-pay

## 2023-02-03 MED ORDER — SPIRONOLACTONE 50 MG PO TABS
50.0000 mg | ORAL_TABLET | Freq: Two times a day (BID) | ORAL | 1 refills | Status: DC
  Filled 2023-02-03: qty 180, 90d supply, fill #0
  Filled 2023-04-29: qty 180, 90d supply, fill #1

## 2023-02-10 ENCOUNTER — Ambulatory Visit: Payer: 59 | Admitting: Behavioral Health

## 2023-02-10 DIAGNOSIS — F418 Other specified anxiety disorders: Secondary | ICD-10-CM | POA: Diagnosis not present

## 2023-02-10 NOTE — Progress Notes (Signed)
[facility] Counselor/Therapist Progress Note  Patient ID: Jamie Holder, MRN: [id number],    Date: 02/10/2023  Time Spent: 55 min Caregility video; Pt is in her car in private & Provider working remotely from [facility]. Pt is aware of the risks/limitations of telehealth & consents today.   Treatment Type: Individual Therapy  Reported Symptoms: Elevated anx/dep & stress due to marital tension that includes interactions w/her [name] that results in his anger reactions.   Mental Status Exam: Appearance:  Casual     Behavior: Appropriate and Sharing  Motor: Normal  Speech/Language:  Clear and Coherent  Affect: Appropriate  Mood: anxious  Thought process: normal  Thought content:   WNL  Sensory/Perceptual disturbances:   WNL  Orientation: oriented to person, place, time/date, and situation  Attention: Good  Concentration: Good  Memory: WNL  Fund of knowledge:  Good  Insight:   Good  Judgment:  Good  Impulse Control: Good   Risk Assessment: Danger to Self:  No Self-injurious Behavior: No Danger to Others: No Duty to Warn:no Physical Aggression / Violence:No  Access to Firearms a concern: No  Gang Involvement:No   Subjective: Pt is in upbeat mood. She is concerned for her [name] level of anger when she expresses her anxiety. He will become frustrated or lose his temper & yell. He then walks away. Pt relates her [name] anger when she is unwilling to be intimate w/him. This is esp'ly worrisome for her bc he seems to feel everything in their marriage should be so easy, & her feelings get hurt or disregarded when he is in his anger.  [name] has been the Medical Dir of [employer] ICU Depts for 3 mos now. She has noticed how the stress is wearing on him & encourages him to share w/her when he can so she can feel closer & understand his world better.   Pt has explored auditing a Biology Class @ [employer] in the Fall. She feels this will re-engage her into life & give  her the mental stimulation she needs now that her children are a bit older.   Interventions: Family Systems & Cpl work using Art therapist  Diagnosis:Depression with anxiety  Plan: Kelseigh c/o her [name] temper flares. She will take into consideration how his work stress is impacting their time tgthr. She will explore the [facility] website & take the Quiz suggested to further her awareness of the marital relationship. Next visit, [name] will join the session & we will use the quiz to promote our Cpl discussion.   Target Date: 02/24/2023  Progress: 2  Frequency: Once every 2-3 wks  Modality: Pinkey Mcjunkin wants to know more about the 'Five Love Languages' by [employer]. She will use the website provided to allow both herself & her [name] to take the Quiz.  She & [name] will bring these quizzes when they come for Cpl session in late July so we can understand the emot'l side of the relationship more fully.  Target Date: 02/24/2023  Progress: 2  Frequency: Once every 2-3 wks  Modality: [name], LMFT

## 2023-02-10 NOTE — Progress Notes (Signed)
                Victoria L Winstead, LMFT 

## 2023-02-24 ENCOUNTER — Ambulatory Visit: Payer: 59 | Admitting: Behavioral Health

## 2023-02-24 DIAGNOSIS — F418 Other specified anxiety disorders: Secondary | ICD-10-CM | POA: Diagnosis not present

## 2023-02-24 NOTE — Progress Notes (Signed)
Jamie Holder Progress Note  Patient ID: Jamie Holder, MRN: [id number],    Date: 02/24/2023  Time Spent: 55 min In Person @ [facility]. Time In: 9:00am Time Out: 9:55am  Treatment Type:  Cpl Th  Reported Symptoms: Reduction in anx/dep & some stress since the Summer months have arrived. Cpl is struggling to make roles work & the marital conflict to reduce.  Mental Status Exam: Appearance:  Casual     Behavior: Appropriate and Sharing  Motor: Normal  Speech/Language:  Clear and Coherent and Normal Rate  Affect: Appropriate  Mood: normal  Thought process: normal  Thought content:   WNL  Sensory/Perceptual disturbances:   WNL  Orientation: oriented to person, place, time/date, and situation  Attention: Good  Concentration: Good  Memory: WNL  Fund of knowledge:  Good  Insight:   Good  Judgment:  Good  Impulse Control: Good   Risk Assessment: Danger to Self:  No Self-injurious Behavior: No Danger to Others: No Duty to Warn:no Physical Aggression / Violence:No  Access to Firearms a concern: No  Gang Involvement:No   Subjective: Pt & [name] here today. [name] is stressed & Pt is coming off busy Bday wknd for Dtr Jamie Holder who turned 39yo. [name] has worked constantly on nights & now transitioning back to days. He c/o her procrastination & his drinking habit which he is worried about & aware. Pt is doing her best to assist [name] w/the transition into the home daily after her works a shift. His new position as Clin Dir of ICU for [employer] has been challenging.   [name] c/o children who constantly whine & bicker some. He is trying to tolerate their beh. Children are demanding activity from Dad & [name] is striving to participate.   Pt finds anger to be an issue. Her anger when expressed to the kids ends in negotiation. His anger expressed results in the children running away & hiding. Managing his temper is posing issues. He wants the clutter on the  floor to be clean when he gets home. Pt needs his help, understanding & participation w/the children.  Interventions: Family Systems; F Table Basket to promote cohesion, cooperation, & closeness. Mom is the negotiator, Dad is the strict Parent-change up these roles to see if you can empathize w/the other Parent & understand their perspective better.   Diagnosis:Depression with anxiety  Plan: Engage the children w/a basket of fun activities you only choose @ dinner to gain more cooperation & reduce the whining. Do this nightly, but only if the children inc their listening time & reduce the whines.  Target Date: 03/28/2023  Progress: 2  Frequency: Once every 2-3 wks  Modality: Cpl Th Switch roles a few times & see if this disarms the patterned response by the children to keep Mom in the negotiator role & Dad as the Strict Parent. Note the differences you see. Cont this only if the children improve.  Target Date: 03/28/2023  Progress: 1  Frequency: Once every 2-3 wks or as [name] schedule allows   Modality: Cpl Th  Deneise Lever, LMFT

## 2023-02-24 NOTE — Progress Notes (Signed)
                Victoria L Winstead, LMFT 

## 2023-03-13 ENCOUNTER — Other Ambulatory Visit (HOSPITAL_COMMUNITY): Payer: Self-pay

## 2023-03-13 ENCOUNTER — Other Ambulatory Visit: Payer: Self-pay

## 2023-03-17 ENCOUNTER — Ambulatory Visit (INDEPENDENT_AMBULATORY_CARE_PROVIDER_SITE_OTHER): Payer: 59 | Admitting: Behavioral Health

## 2023-03-17 DIAGNOSIS — F418 Other specified anxiety disorders: Secondary | ICD-10-CM | POA: Diagnosis not present

## 2023-03-17 NOTE — Progress Notes (Signed)
                Victoria L Winstead, LMFT 

## 2023-03-17 NOTE — Progress Notes (Addendum)
[facility] Counselor/Therapist Progress Note  Patient ID: Genetha Colliver, MRN: [id number],    Date: 03/17/2023  Time Spent: 55 min Caregility video; Pt is home in private & Provider working remote from [facility]. Pt is aware of the risks/limitations of telehealth & consents to Tx today. Time In: 11:00am Time Out: 11:55am  Treatment Type: Individual Therapy  Reported Symptoms: Elevated anx/dep & stress due to current circumstances in the Family w/[name] job stress & schedule w/kids starting Sch, esp'ly Son starting [employer].  Mental Status Exam: Appearance:  Casual     Behavior: Appropriate and Sharing  Motor: Normal  Speech/Language:  Clear and Coherent  Affect: Appropriate  Mood: normal  Thought process: normal  Thought content:   WNL  Sensory/Perceptual disturbances:   WNL  Orientation: oriented to person, place, time/date, and situation  Attention: Good  Concentration: Good  Memory: WNL  Fund of knowledge:  Good  Insight:   Good  Judgment:  Good  Impulse Control: Good   Risk Assessment: Danger to Self:  No Self-injurious Behavior: No Danger to Others: No Duty to Warn:no Physical Aggression / Violence:No  Access to Firearms a concern: No  Gang Involvement:No   Subjective: 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 She feels lack of motivation & heightened emotionality w/the children.   Interventions: Family Systems  Diagnosis:Depression with anxiety  Plan: Dossie will cont to make efforts towards getting the children readied for School. Pt is trying to do things w/the kids that involve creativity. She will have them decorate a Chore Box & write out cards of tasks that when completed can be placed in the top of the box in a cut opening. They can adorn the cards w/colored stars to keep track. They will make a mailbox for Dad to open when he gets home that contain notes from them both to Dad. She will try to exercise w/the  kids @ home until she can get to [place] Classes in the studio. She will create 'Quiet Time' for the 3 of them to read & be more spontaneous.   Target Date: 04/28/2023  Progress: 4  Frequency: Once every 2-3 wks  Modality: Indiv Pt wishes to be screened for ADHD next visit as her Mother & Str have both been Dx'd.  Target Date: 04/13/2023  Progress: 0  Frequency: Once every 2-3 wks  Modality: Indiv   This wknd, [name] Jesusita Oka is off work. They will use this time to make a Day Trip for the entire Family.  Target Date: 04/28/2023  Progress: 5  Frequency: Once every 3-4 wks  Modality: [name], LMFT

## 2023-04-02 ENCOUNTER — Other Ambulatory Visit (HOSPITAL_COMMUNITY): Payer: Self-pay

## 2023-04-14 ENCOUNTER — Ambulatory Visit (INDEPENDENT_AMBULATORY_CARE_PROVIDER_SITE_OTHER): Payer: 59 | Admitting: Behavioral Health

## 2023-04-14 DIAGNOSIS — F418 Other specified anxiety disorders: Secondary | ICD-10-CM

## 2023-04-14 NOTE — Progress Notes (Unsigned)
                Jamie Holder Jewell L Farryn Linares, LMFT 

## 2023-04-15 NOTE — Progress Notes (Addendum)
[facility] Counselor/Therapist Progress Note  Patient ID: Jamie Holder, MRN: [id number],    Date: 04/14/2023  Time Spent: 55 min In Person @ [facility] Time In: 9:00am Time Out: 9:55am   Treatment Type: Individual Therapy  Reported Symptoms: Elevated anx/dep & stress since the start of the Sch Year for her 2 young children. Pt is confronted w/the possibility of moving her Family after 4 yrs in the [place] area due to [name] job exploration. Pt is encountering this stressor w/added anx/dep & stress.  Mental Status Exam: Appearance:  Casual     Behavior: Appropriate and Sharing  Motor: Normal  Speech/Language:  Clear and Coherent and Normal Rate  Affect: Appropriate  Mood: anxious  Thought process: normal  Thought content:   WNL  Sensory/Perceptual disturbances:   WNL  Orientation: oriented to person, place, time/date, and situation  Attention: Good  Concentration: Good  Memory: WNL  Fund of knowledge:  Good  Insight:   Good  Judgment:  Good  Impulse Control: Good   Risk Assessment: Danger to Self:  No Self-injurious Behavior: No Danger to Others: No Duty to Warn:no Physical Aggression / Violence:No  Access to Firearms a concern: No  Gang Involvement:No   Subjective: Pt is concerned today for [name] job & his dissatisfaction w/it. He is exploring the possibility of changing his job & has initiated discussions w/Pt about this. She has reached out to a good friend whose [name] is Colleague to her [name]. They have much in common & this has helped her to cope better. Her [name] has a Research scientist (physical sciences) @ the end of Sept. He is expected to take a pay cut & has taken inc'd time off the past few wks. This has resulted in her worry for the children; Dtr Amelia's anx & feelings of safety for both children as Son Jerilynn Som has just entered [place].   F/U on ADHD concerns today & promoted completion of ASRS-v1.1.  Part A: 3/6 w/procrastination concerns being  endorsed often. Part B: 4/12; endorsing being easily distracted by noise as very often, & working to keep her attn during boring, repetitive tasks as often. Pt cont's to feel she is less than optimum in her productivity, wasting time, & sitting @ home doing nothing-even taking 3 hr naps. Discussed the general options for testing & will elucidate this further after consult w/Dr. Charlies Constable, PhD so Clinician can offer more information f/u.  Pt is concerned since her Str & Mother both have ADHD Dx. Her Mother is 39yo.   Interventions: Solution-Oriented/Positive Psychology, Psycho-education/Bibliotherapy, and Family Systems  Diagnosis:Depression with anxiety  Plan: Annett is concerned if she has Adult ADHD. Provided psychoedu for this concern & Dx. Directed Pt to call [facility] & speak w/Admin Staff about testing & this Clinician will consult w/Dr. Reggy Eye. Pt agreed. Pt has been overwhelmed since the start of the Sch Year. She has estb'd a morning routine w/the children & her [name]. Her Son has been having crying tantrums @ the end of the day. Normalized & validated this beh for her Son. Promoted her approach to his tantrums. Son Jerilynn Som has cried twice due to Dad's absence & his dense work schedule. Dad is working 13-14 days @ a stretch since the [employer] began. Aylanie will cont to monitor & support the household through this first month of the Sch Year. She will note any progress using the tools we reviewed today.  Target Date: 05/13/2023  Progress: 6  Frequency: Once every 2-3 wks  Modality: [name], LMFT

## 2023-04-16 ENCOUNTER — Other Ambulatory Visit (HOSPITAL_COMMUNITY): Payer: Self-pay

## 2023-04-29 ENCOUNTER — Other Ambulatory Visit (HOSPITAL_COMMUNITY): Payer: Self-pay

## 2023-05-05 ENCOUNTER — Ambulatory Visit (INDEPENDENT_AMBULATORY_CARE_PROVIDER_SITE_OTHER): Payer: 59 | Admitting: Behavioral Health

## 2023-05-05 DIAGNOSIS — F4323 Adjustment disorder with mixed anxiety and depressed mood: Secondary | ICD-10-CM | POA: Diagnosis not present

## 2023-05-05 NOTE — Progress Notes (Signed)
[facility] Counselor/Therapist Progress Note  Patient ID: Jamie Holder, MRN: [id number],    Date: 05/05/2023  Time Spent: 55 min In Person @ [facility]   Treatment Type:  Cpl Th  Reported Symptoms: elevated anx/dep & stress, although the new Sch Year is going well, [name] stress is elevated also.  Mental Status Exam: Appearance:  Casual     Behavior: Appropriate and Sharing  Motor: Normal  Speech/Language:  Clear and Coherent  Affect: Appropriate  Mood: anxious & postive; supportive of [name], career, & Family  Thought process: normal  Thought content:   WNL  Sensory/Perceptual disturbances:   WNL  Orientation: oriented to person, place, time/date, and situation  Attention: Good  Concentration: Good  Memory: WNL  Fund of knowledge:  Good  Insight:   Good  Judgment:  Good  Impulse Control: Good   Risk Assessment: Danger to Self:  No Self-injurious Behavior: No Danger to Others: No Duty to Warn:no Physical Aggression / Violence:No  Access to Firearms a concern: No  Gang Involvement:No   Subjective: Pt & [name] here today discussing the future for Family & [name] career. They are working tgthr & Pt wants best for him. [name] also wants for his Wife, the happiness & Family life they are aiming for w/the children.   Cpl is taking time this week for trip to [place], [place]. Pt's Parents are here this week to stay & provide childcare.  Interventions: Family Systems & Insight-Oriented, career support, Family cohesion  Diagnosis:Adjustment disorder with mixed anxiety and depressed mood  Plan: Jamie Holder are navigating a difficult & rigorous schedule for [name] career & they have been discussing the future. They were able to walk on an evening 2 wks ago. It was refreshing for them to have this time alone. They plan to do this more often, w/some flexibility built in. Pt desires for [name] to look less defeated coming home @ night, have more energy & be  engaged w/the children more as possible. They are on the same page.  [name] has been to see a Life Coach in the past & is willing secure a Referral from his PCP, Jamie Khat, MD.  Target Date: 05/28/2023  Progress: 5  Frequency: Once every 2-3 wks  Modality: Cpl Th  Deneise Lever, LMFT

## 2023-05-05 NOTE — Progress Notes (Signed)
                Anastasya Jewell L Farryn Linares, LMFT 

## 2023-05-08 NOTE — Progress Notes (Signed)
Jamie Holder is a 37 y.o. female and is here for a comprehensive physical exam.  HPI Health Maintenance Due  Topic Date Due   COVID-19 Vaccine (4 - 2023-24 season) 03/30/2023   Acute Concerns: S/p Cholecystectomy: On 09/17/22 she underwent a laparoscopic cholecystectomy.  States she is tolerating well overall since surgery. Notes she is still taking omeprazole, and occasionally has reflux at night when she doesn't. Endorses occasional diarrhea.   Weight Loss: Reports that she'd like to try another weight loss intervention as the Semaglutide worked effectively to cancel food noise but the nausea associated was not worth continuing. Endorses binge eating.  Discussed Phentermine or Vyvanse to assist weight loss.   Chronic Issues: Acne Managed/Compliant with 50 mg Spironolactone twice daily and micronor Denies excessive caffeine intake, stimulant usage, excessive alcohol intake, or increase in salt consumption.  Denies chest pain, shortness of breath, blurred vision, dizziness, unusual headaches, or lower leg swelling. BP Readings from Last 3 Encounters:  05/12/23 120/84  09/18/22 124/85  05/08/22 130/80   Anxiety: Managed with 60 mg Prozac daily.  Health Maintenance: Immunizations -- UpToDate - flu shot given today Colonoscopy/Endoscopy/Cologuard-- N/A Mammogram-- Diagnostic due to palpable lump; last done 11/01/21. Category C density, fat necrosis found in region of lump, otherwise normal. Repeat in 2 years.  PAP-- Last done 09/22/20. Results were normal. Repeat 2027. Bone Density-- N/A Diet -- Healthy overall Exercise -- Regular exercise: barre classes  Sleep habits -- Good sleep quality overall Mood -- Stable  UTD with dentist? - Yes. UTD with eye doctor? - No, has not needed to recently.   Weight history: Wt Readings from Last 10 Encounters:  05/12/23 189 lb 9.6 oz (86 kg)  09/17/22 170 lb (77.1 kg)  05/08/22 182 lb (82.6 kg)  02/04/22 197 lb 4 oz (89.5  kg)  11/05/21 204 lb 4 oz (92.6 kg)  10/17/21 203 lb (92.1 kg)  05/07/21 200 lb 4 oz (90.8 kg)  09/22/20 187 lb (84.8 kg)  08/11/20 191 lb 8 oz (86.9 kg)  02/04/20 188 lb 9.6 oz (85.5 kg)   Body mass index is 30.6 kg/m. No LMP recorded. (Menstrual status: Oral contraceptives).  Alcohol use:  reports current alcohol use of about 4.0 standard drinks of alcohol per week.  Tobacco use:  Tobacco Use: Low Risk  (05/12/2023)   Patient History    Smoking Tobacco Use: Never    Smokeless Tobacco Use: Never    Passive Exposure: Not on file   Eligible for lung cancer screening? no     05/12/2023    8:46 AM  Depression screen PHQ 2/9  Decreased Interest 0  Down, Depressed, Hopeless 0  PHQ - 2 Score 0  Altered sleeping 1  Tired, decreased energy 1  Change in appetite 1  Feeling bad or failure about yourself  0  Trouble concentrating 0  Moving slowly or fidgety/restless 0  Suicidal thoughts 0  PHQ-9 Score 3  Difficult doing work/chores Not difficult at all     Other providers/specialists: Patient Care Team: Jarold Motto, [place] as PCP - General (Physician Assistant) Rosemarie Beath, NP as Nurse Practitioner (Psychology) Melida Quitter, OD as Attending Physician (Optometry)   PMHx, SurgHx, SocialHx, Medications, and Allergies were reviewed in the Visit Navigator and updated as appropriate.   Past Medical History:  Diagnosis Date   Allergy    Seasonal   Anxiety    GERD (gastroesophageal reflux disease) 2016   PONV (postoperative nausea and vomiting)  Past Surgical History:  Procedure Laterality Date   ABDOMINAL SURGERY     BREAST REDUCTION SURGERY  04/28/2021   CHOLECYSTECTOMY N/A 09/17/2022   Procedure: LAPAROSCOPIC CHOLECYSTECTOMY;  Surgeon: Berna Bue, MD;  Location: [facility] OR;  Service: General;  Laterality: N/A;   CHOLECYSTECTOMY  09/17/2022   COSMETIC SURGERY  04/2021   Breast reduction and abdominalplasty   ESOPHAGOSCOPY W/ BOTOX INJECTION     WISDOM  TOOTH EXTRACTION     Family History  Problem Relation Age of Onset   Depression Mother    Hearing loss Mother    Hyperlipidemia Mother    Hypertension Mother    Thyroid cancer Mother    Anxiety disorder Mother    ADD / ADHD Mother    Hyperlipidemia Father    Hypertension Father    Depression Sister    Anxiety disorder Sister    ADD / ADHD Sister    Heart attack Maternal Grandmother    Heart attack Paternal Grandmother    Breast cancer Neg Hx    Colon cancer Neg Hx    Osteoporosis Neg Hx    Social History   Tobacco Use   Smoking status: Never   Smokeless tobacco: Never  Vaping Use   Vaping status: Never Used  Substance Use Topics   Alcohol use: Yes    Alcohol/week: 4.0 standard drinks of alcohol    Types: 4 Standard drinks or equivalent per week   Drug use: Never   Review of Systems:   Review of Systems  Constitutional:  Negative for chills, fever, malaise/fatigue and weight loss.  HENT:  Negative for hearing loss, sinus pain and sore throat.   Respiratory:  Negative for cough and hemoptysis.   Cardiovascular:  Negative for chest pain, palpitations, leg swelling and PND.  Gastrointestinal:  Negative for abdominal pain, constipation, diarrhea, heartburn, nausea and vomiting.  Genitourinary:  Negative for dysuria, frequency and urgency.  Musculoskeletal:  Negative for back pain, myalgias and neck pain.  Skin:  Negative for itching and rash.  Neurological:  Negative for dizziness, tingling, seizures and headaches.  Endo/Heme/Allergies:  Negative for polydipsia.  Psychiatric/Behavioral:  Negative for depression. The patient is not nervous/anxious.      Objective:   BP 120/84   Pulse 86   Temp 97.7 F (36.5 C)   Ht 5\' 6"  (1.676 m)   Wt 189 lb 9.6 oz (86 kg)   SpO2 99%   BMI 30.60 kg/m  Body mass index is 30.6 kg/m.   General Appearance:    Alert, cooperative, no distress, appears stated age  Head:    Normocephalic, without obvious abnormality, atraumatic   Eyes:    PERRL, conjunctiva/corneas clear, EOM's intact, fundi    benign, both eyes  Ears:    Normal TM's and external ear canals, both ears  Nose:   Nares normal, septum midline, mucosa normal, no drainage    or sinus tenderness  Throat:   Lips, mucosa, and tongue normal; teeth and gums normal  Neck:   Supple, symmetrical, trachea midline, no adenopathy;    thyroid:  no enlargement/tenderness/nodules; no carotid   bruit or JVD  Back:     Symmetric, no curvature, ROM normal, no CVA tenderness  Lungs:     Clear to auscultation bilaterally, respirations unlabored  Chest Wall:    No tenderness or deformity   Heart:    Regular rate and rhythm, S1 and S2 normal, no murmur, rub or gallop  Breast Exam:    Deferred  Abdomen:     Soft, non-tender, bowel sounds active all four quadrants,    no masses, no organomegaly  Genitalia:    Deferred   Extremities:   Extremities normal, atraumatic, no cyanosis or edema  Pulses:   2+ and symmetric all extremities  Skin:   Skin color, texture, turgor normal, no rashes or lesions  Lymph nodes:   Cervical, supraclavicular, and axillary nodes normal  Neurologic:   CNII-XII intact, normal strength, sensation and reflexes    throughout    Assessment/Plan:   Routine physical examination Today patient counseled on age appropriate routine health concerns for screening and prevention, each reviewed and up to date or declined. Immunizations reviewed and up to date or declined. Labs ordered and reviewed. Risk factors for depression reviewed and negative. Hearing function and visual acuity are intact. ADLs screened and addressed as needed. Functional ability and level of safety reviewed and appropriate. Education, counseling and referrals performed based on assessed risks today. Patient provided with a copy of personalized plan for preventive services.  Depression with anxiety Overall controlled per patient Continue Prozac 60 mg daily Continue talk  therapy Follow-up in 1 year, sooner if concerns  Binge eating disorder, unspecified severity Uncontrolled Discussed side effect(s), risks, benefits of Vyvanse - she would like to trial  Start Vyvanse 30 mg daily Message in 2-4 weeks on status of symptom(s) and medication(s)  Follow-up in 3 month(s), sooner if concerns  Obesity, unspecified class, unspecified obesity type, unspecified whether serious comorbidity present Continue healthy lifestyle efforts  Acne Well controlled per patient Continue spironolactone 50 mg twice daily and  micronor Follow-up yearly, sooner if concerns Update BMP today  Need for influenza vaccination Update flu shot   I,Linet Lagle,acting as a scribe for [employer], PA.,have documented all relevant documentation on the behalf of Jarold Motto, PA,as directed by  Jarold Motto, PA while in the presence of Jarold Motto, [place].  I, Jarold Motto, [place], have reviewed all documentation for this visit. The documentation on 05/12/23 for the exam, diagnosis, procedures, and orders are all accurate and complete.  Jarold Motto, PA-C [place] [facility] [employer] [facility]

## 2023-05-12 ENCOUNTER — Other Ambulatory Visit: Payer: Self-pay

## 2023-05-12 ENCOUNTER — Ambulatory Visit: Payer: 59 | Admitting: Physician Assistant

## 2023-05-12 ENCOUNTER — Other Ambulatory Visit (HOSPITAL_COMMUNITY): Payer: Self-pay

## 2023-05-12 ENCOUNTER — Encounter: Payer: Self-pay | Admitting: Physician Assistant

## 2023-05-12 VITALS — BP 120/84 | HR 86 | Temp 97.7°F | Ht 66.0 in | Wt 189.6 lb

## 2023-05-12 DIAGNOSIS — F50819 Binge eating disorder, unspecified: Secondary | ICD-10-CM | POA: Diagnosis not present

## 2023-05-12 DIAGNOSIS — Z0000 Encounter for general adult medical examination without abnormal findings: Secondary | ICD-10-CM

## 2023-05-12 DIAGNOSIS — Z23 Encounter for immunization: Secondary | ICD-10-CM

## 2023-05-12 DIAGNOSIS — L709 Acne, unspecified: Secondary | ICD-10-CM | POA: Diagnosis not present

## 2023-05-12 DIAGNOSIS — E669 Obesity, unspecified: Secondary | ICD-10-CM | POA: Diagnosis not present

## 2023-05-12 DIAGNOSIS — F418 Other specified anxiety disorders: Secondary | ICD-10-CM | POA: Diagnosis not present

## 2023-05-12 DIAGNOSIS — Z0001 Encounter for general adult medical examination with abnormal findings: Secondary | ICD-10-CM

## 2023-05-12 LAB — LIPID PANEL
Cholesterol: 263 mg/dL — ABNORMAL HIGH (ref 0–200)
HDL: 63.6 mg/dL
LDL Cholesterol: 125 mg/dL — ABNORMAL HIGH (ref 0–99)
NonHDL: 199.77
Total CHOL/HDL Ratio: 4
Triglycerides: 374 mg/dL — ABNORMAL HIGH (ref 0.0–149.0)
VLDL: 74.8 mg/dL — ABNORMAL HIGH (ref 0.0–40.0)

## 2023-05-12 LAB — CBC WITH DIFFERENTIAL/PLATELET
Basophils Absolute: 0.1 10*3/uL (ref 0.0–0.1)
Basophils Relative: 0.7 % (ref 0.0–3.0)
Eosinophils Absolute: 0.1 10*3/uL (ref 0.0–0.7)
Eosinophils Relative: 1.1 % (ref 0.0–5.0)
HCT: 41 % (ref 36.0–46.0)
Hemoglobin: 13.1 g/dL (ref 12.0–15.0)
Lymphocytes Relative: 26.8 % (ref 12.0–46.0)
Lymphs Abs: 2.4 10*3/uL (ref 0.7–4.0)
MCHC: 31.8 g/dL (ref 30.0–36.0)
MCV: 87.5 fL (ref 78.0–100.0)
Monocytes Absolute: 0.4 10*3/uL (ref 0.1–1.0)
Monocytes Relative: 4.3 % (ref 3.0–12.0)
Neutro Abs: 6.1 10*3/uL (ref 1.4–7.7)
Neutrophils Relative %: 67.1 % (ref 43.0–77.0)
Platelets: 354 10*3/uL (ref 150.0–400.0)
RBC: 4.69 Mil/uL (ref 3.87–5.11)
RDW: 13.5 % (ref 11.5–15.5)
WBC: 9.1 10*3/uL (ref 4.0–10.5)

## 2023-05-12 LAB — COMPREHENSIVE METABOLIC PANEL
ALT: 17 U/L (ref 0–35)
AST: 15 U/L (ref 0–37)
Albumin: 4.3 g/dL (ref 3.5–5.2)
Alkaline Phosphatase: 58 U/L (ref 39–117)
BUN: 12 mg/dL (ref 6–23)
CO2: 24 meq/L (ref 19–32)
Calcium: 9.8 mg/dL (ref 8.4–10.5)
Chloride: 101 meq/L (ref 96–112)
Creatinine, Ser: 0.8 mg/dL (ref 0.40–1.20)
GFR: 93.22 mL/min (ref >60.00)
Glucose, Bld: 87 mg/dL (ref 70–99)
Potassium: 4 meq/L (ref 3.5–5.1)
Sodium: 135 meq/L (ref 135–145)
Total Bilirubin: 0.2 mg/dL (ref 0.2–1.2)
Total Protein: 7.8 g/dL (ref 6.0–8.3)

## 2023-05-12 MED ORDER — OMEPRAZOLE 20 MG PO CPDR
20.0000 mg | DELAYED_RELEASE_CAPSULE | Freq: Every day | ORAL | 3 refills | Status: DC
  Filled 2023-05-12: qty 90, 90d supply, fill #0
  Filled 2023-07-30 – 2023-08-04 (×2): qty 90, 90d supply, fill #1
  Filled 2023-11-04: qty 90, 90d supply, fill #2
  Filled 2023-12-12 – 2024-02-07 (×2): qty 90, 90d supply, fill #0

## 2023-05-12 MED ORDER — LISDEXAMFETAMINE DIMESYLATE 30 MG PO CAPS
30.0000 mg | ORAL_CAPSULE | Freq: Every day | ORAL | 0 refills | Status: DC
  Filled 2023-05-12: qty 30, 30d supply, fill #0

## 2023-05-12 NOTE — Patient Instructions (Addendum)
It was great to see you!  Trial the Vyvanse 30 mg -- message me in 2-4 weeks to let me know how this is working for you  -- sooner if concerns If you would like to continue this prescription, please follow-up in 3 months in the office.  Please go to the lab for blood work.   Our office will call you with your results unless you have chosen to receive results via MyChart.  If your blood work is normal we will follow-up each year for physicals and as scheduled for chronic medical problems.  If anything is abnormal we will treat accordingly and get you in for a follow-up.  Take care,  Lelon Mast

## 2023-05-13 ENCOUNTER — Other Ambulatory Visit: Payer: Self-pay

## 2023-05-19 ENCOUNTER — Other Ambulatory Visit (HOSPITAL_COMMUNITY): Payer: Self-pay

## 2023-05-19 ENCOUNTER — Encounter: Payer: Self-pay | Admitting: Obstetrics and Gynecology

## 2023-05-19 ENCOUNTER — Ambulatory Visit (INDEPENDENT_AMBULATORY_CARE_PROVIDER_SITE_OTHER): Payer: 59 | Admitting: Obstetrics and Gynecology

## 2023-05-19 VITALS — BP 156/97 | HR 101 | Ht 66.0 in | Wt 189.0 lb

## 2023-05-19 DIAGNOSIS — Z3041 Encounter for surveillance of contraceptive pills: Secondary | ICD-10-CM

## 2023-05-19 DIAGNOSIS — Z01419 Encounter for gynecological examination (general) (routine) without abnormal findings: Secondary | ICD-10-CM | POA: Diagnosis not present

## 2023-05-19 DIAGNOSIS — R61 Generalized hyperhidrosis: Secondary | ICD-10-CM | POA: Diagnosis not present

## 2023-05-19 MED ORDER — NORETHINDRONE 0.35 MG PO TABS
1.0000 | ORAL_TABLET | Freq: Every day | ORAL | 3 refills | Status: DC
  Filled 2023-05-19: qty 84, 84d supply, fill #0
  Filled 2023-08-20: qty 84, 84d supply, fill #1
  Filled 2023-11-08: qty 84, 84d supply, fill #2
  Filled 2023-12-12 – 2024-01-31 (×2): qty 84, 84d supply, fill #0

## 2023-05-19 NOTE — Progress Notes (Signed)
ANNUAL EXAM Patient name: Jamie Holder MRN [id number]  Date of birth: December 29, 1985 Chief Complaint:   Gynecologic Exam  History of Present Illness:   Jamie Holder is a 37 y.o. [phone] female being seen today for a routine annual exam.   Current concerns:   Discussed the use of AI scribe software for clinical note transcription with the patient, who gave verbal consent to proceed.  The patient, a mother of two children aged 74 and five, presents for a regular checkup with concerns about perimenopause. She reports experiencing night sweats, chin hair growth, and fatigue, which she attributes to both perimenopause and the demands of motherhood. She is currently on the mini pill for birth control and does not have periods, making it difficult to track any changes in her menstrual cycle. She expresses satisfaction with the mini pill and does not plan to have more children. She also reports experiencing some vaginal dryness. Her mother went through menopause around the age of 30, but she is unsure when she began experiencing symptoms. She has been reading about menopause and is seeking advice on what to watch out for and how to maintain her wellbeing during this time. She also underwent a breast reduction two years ago and has had a gallbladder removal and an abdominoplasty.      Current birth control: POP  No LMP recorded. (Menstrual status: Oral contraceptives).  Last Pap/Pap History: 08/2020. Results were: NILM w/ HRHPV negative. H/O abnormal pap: no   Health Maintenance Due  Topic Date Due   COVID-19 Vaccine (4 - 2023-24 season) 03/30/2023    Review of Systems:   Pertinent items are noted in HPI Denies any headaches, blurred vision, fatigue, shortness of breath, chest pain, abdominal pain, abnormal vaginal discharge/itching/odor/irritation, problems with periods, bowel movements, urination, or intercourse unless otherwise stated above.  Pertinent History  Reviewed:  Reviewed past medical,surgical, social and family history.  Reviewed problem list, medications and allergies. Physical Assessment:   Vitals:   05/19/23 0809  BP: (!) 148/93  Pulse: 98  Weight: 189 lb (85.7 kg)  Height: 5\' 6"  (1.676 m)  Body mass index is 30.51 kg/m.   Physical Examination:  General appearance - well appearing, and in no distress Mental status - alert, oriented to person, place, and time Psych:  She has a normal mood and affect Skin - warm and dry, normal color, no suspicious lesions noted Chest - effort normal Heart - normal rate  Breasts - breasts appear normal, no suspicious masses, no skin or nipple changes or axillary nodes Abdomen - soft, nontender, nondistended, no masses or organomegaly Pelvic -  VULVA: normal appearing vulva with no masses, tenderness or lesions  VAGINA: normal appearing vagina with normal color and discharge, no lesions  CERVIX: normal appearing cervix without discharge or lesions, no CMT UTERUS: uterus is felt to be normal size, shape, consistency and nontender  ADNEXA: No adnexal masses or tenderness noted. Extremities:  No swelling or varicosities noted  Chaperone present for exam  No results found for this or any previous visit (from the past 24 hour(s)).  Assessment & Plan:  Jamie was seen today for gynecologic exam.  Diagnoses and all orders for this visit:  Encounter for annual routine gynecological examination - Cervical cancer screening: Discussed guidelines. Pap with HPV normal 08/2020 - GC/CT: declines - Birth Control: POPs - Breast Health: Encouraged self breast awareness/SBE. Teaching provided.  - F/U 12 months and prn  Night sweats Reports night sweats,  fatigue, and chin hairs. No menstrual cycle due to use of mini pill. Discussed the possibility of early menopause and the limitations of FSH testing in determining menopausal status. -Order FSH blood work today to assess for early menopause. -Continue  current exercise regimen and healthy diet. -Discussed potential future need for hormone replacement therapy. -     FSH  Encounter for surveillance of contraceptive pills -     norethindrone (MICRONOR) 0.35 MG tablet; Take 1 tablet (0.35 mg total) by mouth daily.  Orders Placed This Encounter  Procedures   FSH    Meds:  Meds ordered this encounter  Medications   norethindrone (MICRONOR) 0.35 MG tablet    Sig: Take 1 tablet (0.35 mg total) by mouth daily.    Dispense:  84 tablet    Refill:  3    Follow-up: No follow-ups on file.  Milas Hock, MD 05/19/2023 8:34 AM

## 2023-05-19 NOTE — Progress Notes (Signed)
Last pap 09/22/20- negative

## 2023-05-20 LAB — FOLLICLE STIMULATING HORMONE: FSH: 1.9 m[IU]/mL

## 2023-05-25 ENCOUNTER — Encounter: Payer: Self-pay | Admitting: Physician Assistant

## 2023-05-26 ENCOUNTER — Other Ambulatory Visit (HOSPITAL_COMMUNITY): Payer: Self-pay

## 2023-05-26 ENCOUNTER — Other Ambulatory Visit: Payer: Self-pay

## 2023-05-26 ENCOUNTER — Other Ambulatory Visit: Payer: Self-pay | Admitting: Physician Assistant

## 2023-05-26 ENCOUNTER — Ambulatory Visit (INDEPENDENT_AMBULATORY_CARE_PROVIDER_SITE_OTHER): Payer: 59 | Admitting: Behavioral Health

## 2023-05-26 DIAGNOSIS — F418 Other specified anxiety disorders: Secondary | ICD-10-CM | POA: Diagnosis not present

## 2023-05-26 MED ORDER — LISDEXAMFETAMINE DIMESYLATE 40 MG PO CAPS
40.0000 mg | ORAL_CAPSULE | ORAL | 0 refills | Status: DC
  Filled 2023-05-26: qty 30, 30d supply, fill #0

## 2023-05-26 NOTE — Progress Notes (Signed)
[facility] Counselor/Therapist Progress Note  Patient ID: Jamie Holder, MRN: [id number],    Date: 05/26/2023  Time Spent: 55 min In Person @ [facility] Office Time In: 9:00am Time Out: 9:55am   Treatment Type: Individual Therapy  Reported Symptoms: Cont'd anx/dep & stress w/reduction in anxiety over last session. Pt relates issues w/Binge Eating today that concern her. Her dosage of Vyvanse has been titrated up to assist her w/this issue.  Mental Status Exam: Appearance:  Casual     Behavior: Appropriate and Sharing  Motor: Normal  Speech/Language:  Clear and Coherent  Affect: Appropriate  Mood: normal  Thought process: normal  Thought content:   WNL  Sensory/Perceptual disturbances:   WNL  Orientation: oriented to person, place, time/date, and situation  Attention: Good  Concentration: Good  Memory: WNL  Fund of knowledge:  Good  Insight:   Good  Judgment:  Good  Impulse Control: Good   Risk Assessment: Danger to Self:  No Self-injurious Behavior: No Danger to Others: No Duty to Warn:no Physical Aggression / Violence:No  Access to Firearms a concern: No  Gang Involvement:No   Subjective: Pt sts the trip her & [name] took to [facility] was good. 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 The next trip they have planned is travelling to the [place] in Feb for a week.  Pt is concerned for her 3rd grader [name]. The Special Edu Teacher @ Sch has mentioned her observations to [place]. [name] is a tip-toer. Pt does not want her picked on by other peers. She is trying to honor her Dtr's quirkiness & today she is going to a Parent-Teacher Conf to get some feedback. Dtr has disorganization tendencies, but when Pt placed her pointer finger on  her face & touched her w/a 3-Part instruction, Dtr responded well. She will use this type of method more to assist her Dtr in gathering herself & getting tasks done.  Pt is aware of  her Binge-Eating tendencies, but minimizes them today. Her [name] is unaware of any disordered eating patterns she has currently.   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 She is also intent on cont'g her efforts in her marriage & w/the marital relationship to have improved & deeper conversations.   Target Date: 06/09/2023  Progress: 7  Frequency: Once every 2-3 wks  Modality: Indiv  Next session we will tentatively plan on a Family Therapy session as the kids are out of Sch & [name] has the day off.   Deneise Lever, LMFT

## 2023-05-26 NOTE — Progress Notes (Signed)
                Anastasya Jewell L Farryn Linares, LMFT 

## 2023-06-09 ENCOUNTER — Ambulatory Visit: Payer: 59 | Admitting: Behavioral Health

## 2023-06-09 DIAGNOSIS — F4323 Adjustment disorder with mixed anxiety and depressed mood: Secondary | ICD-10-CM

## 2023-06-09 NOTE — Progress Notes (Signed)
[facility] Counselor/Therapist Progress Note  Patient ID: Jamie Holder, MRN: [id number],    Date: 06/09/2023  Time Spent: 55 min In Person @ [facility] Office Time In: 9:00am Time Out: 9:55am    Treatment Type: Family Th  Reported Symptoms: Pt has brought her Family in the session today as planned. [name] is also present. Children & Fr are off sch & work & plan to decorate the Christmas tree tgthr.  Mental Status Exam: Appearance:  Casual     Behavior: Appropriate and Sharing  Motor: Normal; Son Jamie Holder is full of energy & moving around a lot. Dtr Jamie Holder is more in charge of her beh, but also moving some around the card game.   Speech/Language:  Clear and Coherent  Affect: Appropriate  Mood: anxious  Thought process: normal  Thought content:   WNL  Sensory/Perceptual disturbances:   WNL  Orientation: oriented to person, place, time/date, and situation  Attention: Good; children are good listeners, even though very active Px'ly  Concentration: Good  Memory: WNL  Fund of knowledge:  Good  Insight:   Good  Judgment:  Good  Impulse Control: Good   Risk Assessment: Danger to Self:  No Self-injurious Behavior: No Danger to Others: No Duty to Warn:no Physical Aggression / Violence:No  Access to Firearms a concern: No  Gang Involvement:No   Subjective: Children are excited for the day off & looking forward to Dad being home so they can decorate their Christmas Tree.    Interventions: Family Systems and played the card game 'I Spy'; the matching game version; encouraged to reinforce memory skills, learn tactical & strategy thinking, & cooperate taking turns & encouraging Family members.  Older & younger Siblings cooperating & being kind to ea other. Children doing well taking turns & helping ea other. This activity engaged the Children, the Adults & allows them to get to know the Therapist as she is a stranger. Children both had  fun!  Diagnosis:Adjustment disorder with mixed anxiety and depressed mood  Plan: Children are excited to return for another session. Jamie Holder has a friend who is also in Therapy. We will f/u on this @ a later date to assist her w/anxiety. Jamie Holder is especially active today & unable to sit still. Next session we will be more Px'ly active to keep Calvin more engaged.   Target Date: 07/13/2023  Progress: 5  Frequency: Once every 2-3 mos  Modality: Family Th  Deneise Lever, LMFT

## 2023-06-09 NOTE — Progress Notes (Signed)
                Anastasya Jewell L Farryn Linares, LMFT 

## 2023-06-10 ENCOUNTER — Other Ambulatory Visit (HOSPITAL_COMMUNITY): Payer: Self-pay

## 2023-06-10 ENCOUNTER — Other Ambulatory Visit: Payer: Self-pay | Admitting: Physician Assistant

## 2023-06-10 ENCOUNTER — Other Ambulatory Visit: Payer: Self-pay

## 2023-06-10 MED ORDER — LISDEXAMFETAMINE DIMESYLATE 50 MG PO CAPS
50.0000 mg | ORAL_CAPSULE | Freq: Every day | ORAL | 0 refills | Status: DC
  Filled 2023-06-10: qty 30, 30d supply, fill #0

## 2023-06-20 ENCOUNTER — Other Ambulatory Visit (HOSPITAL_COMMUNITY): Payer: Self-pay

## 2023-07-01 ENCOUNTER — Ambulatory Visit: Payer: 59 | Admitting: Behavioral Health

## 2023-07-11 ENCOUNTER — Other Ambulatory Visit: Payer: Self-pay | Admitting: Physician Assistant

## 2023-07-13 MED ORDER — LISDEXAMFETAMINE DIMESYLATE 50 MG PO CAPS
50.0000 mg | ORAL_CAPSULE | Freq: Every day | ORAL | 0 refills | Status: DC
  Filled 2023-07-13: qty 30, 30d supply, fill #0

## 2023-07-13 MED ORDER — FLUOXETINE HCL 20 MG PO CAPS
20.0000 mg | ORAL_CAPSULE | Freq: Every day | ORAL | 3 refills | Status: DC
  Filled 2023-07-13: qty 90, 90d supply, fill #0
  Filled 2023-10-08: qty 90, 90d supply, fill #1
  Filled 2023-12-12 – 2024-01-12 (×2): qty 90, 90d supply, fill #0
  Filled 2024-05-13: qty 90, 90d supply, fill #1

## 2023-07-13 MED ORDER — FLUOXETINE HCL 40 MG PO CAPS
40.0000 mg | ORAL_CAPSULE | Freq: Every day | ORAL | 3 refills | Status: DC
  Filled 2023-07-13: qty 90, 90d supply, fill #0
  Filled 2023-10-08: qty 90, 90d supply, fill #1
  Filled 2023-12-12 – 2024-01-12 (×2): qty 90, 90d supply, fill #0
  Filled 2024-04-21: qty 90, 90d supply, fill #1

## 2023-07-14 ENCOUNTER — Other Ambulatory Visit (HOSPITAL_COMMUNITY): Payer: Self-pay

## 2023-07-14 ENCOUNTER — Other Ambulatory Visit: Payer: Self-pay

## 2023-07-17 ENCOUNTER — Ambulatory Visit: Payer: 59 | Admitting: Behavioral Health

## 2023-07-17 DIAGNOSIS — F4323 Adjustment disorder with mixed anxiety and depressed mood: Secondary | ICD-10-CM

## 2023-07-17 DIAGNOSIS — F418 Other specified anxiety disorders: Secondary | ICD-10-CM | POA: Diagnosis not present

## 2023-07-17 NOTE — Progress Notes (Signed)
                Anastasya Jewell L Farryn Linares, LMFT 

## 2023-07-17 NOTE — Progress Notes (Signed)
[facility] Counselor/Therapist Progress Note  Patient ID: Jamie Holder, MRN: [id number],    Date: 07/17/2023  Time Spent: 55 min Caregility video; Pt is home in her bedrm in private & Provider is working remotely from [employer]. Pt is aware of the risks/limitations of telehealth & consents to Tx today. Time In: 3:00pm Time Out: 3:55pm   Treatment Type: Individual Therapy  Reported Symptoms: Elevated anx/dep & stress due to the Holidays & kids' Sch activities.   Mental Status Exam: Appearance:  Casual     Behavior: Appropriate and Sharing  Motor: Normal  Speech/Language:  Clear and Coherent  Affect: Appropriate  Mood: normal  Thought process: normal  Thought content:   WNL  Sensory/Perceptual disturbances:   WNL  Orientation: oriented to person, place, time/date, and situation  Attention: Good  Concentration: Good  Memory: WNL  Fund of knowledge:  Good  Insight:   Good  Judgment:  Good  Impulse Control: Good   Risk Assessment: Danger to Self:  No Self-injurious Behavior: No Danger to Others: No Duty to Warn:no Physical Aggression / Violence:No  Access to Firearms a concern: No  Gang Involvement:No   Subjective: Pt is expressing her needs through the Holiday Season. Family is the priority & she will keep in mind her chidren need to learn how to be a Team.    Interventions: Family Systems  Diagnosis:Adjustment disorder with mixed anxiety and depressed mood  Depression with anxiety  Plan: [place] will take the Christmas Holiday in stride. She will try to be flexible w/her Family & [name] Dan's Family while they visit over the next 2 wks. She will try to reconcile that her In-Laws want the Family to attend Church. This is a big decision.   Target Date: 08/13/2023  Progress: 7  Frequency: Once every 2-3 wks  Modality: [name], LMFT

## 2023-07-30 ENCOUNTER — Other Ambulatory Visit (HOSPITAL_COMMUNITY): Payer: Self-pay

## 2023-07-31 ENCOUNTER — Other Ambulatory Visit: Payer: Self-pay

## 2023-08-04 ENCOUNTER — Other Ambulatory Visit (HOSPITAL_COMMUNITY): Payer: Self-pay

## 2023-08-04 ENCOUNTER — Other Ambulatory Visit: Payer: Self-pay

## 2023-08-11 ENCOUNTER — Encounter: Payer: Self-pay | Admitting: Physician Assistant

## 2023-08-11 ENCOUNTER — Other Ambulatory Visit (HOSPITAL_COMMUNITY): Payer: Self-pay

## 2023-08-11 ENCOUNTER — Telehealth: Payer: 59 | Admitting: Physician Assistant

## 2023-08-11 ENCOUNTER — Other Ambulatory Visit: Payer: Self-pay

## 2023-08-11 VITALS — Ht 66.0 in | Wt 185.0 lb

## 2023-08-11 DIAGNOSIS — F50819 Binge eating disorder, unspecified: Secondary | ICD-10-CM

## 2023-08-11 MED ORDER — LISDEXAMFETAMINE DIMESYLATE 60 MG PO CAPS
60.0000 mg | ORAL_CAPSULE | ORAL | 0 refills | Status: DC
  Filled 2023-08-11: qty 10, 10d supply, fill #0
  Filled 2023-08-11: qty 30, 30d supply, fill #0
  Filled 2023-08-11: qty 20, 20d supply, fill #0

## 2023-08-11 NOTE — Progress Notes (Signed)
   Virtual Visit via Video Note   I, Jamie Holder, connected with  Jamie Holder  ([id number], 30-Mar-1986) on 08/11/23 at  8:20 AM EST by a video-enabled telemedicine application and verified that I am speaking with the correct person using two identifiers.  Location: Patient: Home Provider: [facility] office   I discussed the limitations of evaluation and management by telemedicine and the availability of in person appointments. The patient expressed understanding and agreed to proceed.    History of Present Illness: Jamie Holder is a 38 y.o. who identifies as a female who was assigned female at birth, and is being seen today for Binge eating. Pt was started on Vyvanse  50 mg in October. Pt said Vyvanse  is working well for binge eating, has increased anxiety a little but is able to handle it. She is interested in increasing dosage. She feels that after about one week of being on new dosage of medication that she is able to get acclimated. Does not feel like her Prozac  needs to be adjusted.  Problems:  Patient Active Problem List   Diagnosis Date Noted   Acute cholecystitis 09/17/2022   Anxiety 11/13/2020   Elevated blood pressure reading 02/04/2020   Gastroesophageal reflux disease 02/04/2020   Obesity 02/04/2020    Allergies: No Known Allergies Medications:  Current Outpatient Medications:    cetirizine (ZYRTEC) 10 MG chewable tablet, Chew 10 mg by mouth daily., Disp: , Rfl:    FLUoxetine  (PROZAC ) 20 MG capsule, Take 1 capsule (20 mg total) by mouth daily., Disp: 90 capsule, Rfl: 3   FLUoxetine  (PROZAC ) 40 MG capsule, Take 1 capsule (40 mg total) by mouth daily., Disp: 90 capsule, Rfl: 3   lisdexamfetamine (VYVANSE ) 60 MG capsule, Take 1 capsule (60 mg total) by mouth every morning., Disp: 30 capsule, Rfl: 0   montelukast  (SINGULAIR ) 10 MG tablet, Take 1 tablet (10 mg total) by mouth at bedtime., Disp: 30 tablet, Rfl: 11   norethindrone   (MICRONOR ) 0.35 MG tablet, Take 1 tablet (0.35 mg total) by mouth daily., Disp: 84 tablet, Rfl: 3   omeprazole  (PRILOSEC) 20 MG capsule, Take 1 capsule (20 mg total) by mouth daily., Disp: 90 capsule, Rfl: 3   spironolactone  (ALDACTONE ) 50 MG tablet, Take 1 tablet (50 mg total) by mouth 2 (two) times daily., Disp: 180 tablet, Rfl: 1  Observations/Objective: Patient is well-developed, well-nourished in no acute distress.  Resting comfortably  at home.  Head is normocephalic, atraumatic.  No labored breathing.  Speech is clear and coherent with logical content.  Patient is alert and oriented at baseline.   Assessment and Plan: 1. Binge eating disorder, unspecified severity (Primary) Improving Increase Vyvanse  to 60 mg daily I have only sent in for one month, but she will reach out in the next few weeks via MyChart if she would like to continue this prescription or reduce dosage Follow-up in 6 month(s), sooner if concerns Denies need for change in anxiety medication  Follow Up Instructions: I discussed the assessment and treatment plan with the patient. The patient was provided an opportunity to ask questions and all were answered. The patient agreed with the plan and demonstrated an understanding of the instructions.  A copy of instructions were sent to the patient via MyChart unless otherwise noted below.   The patient was advised to call back or seek an in-person evaluation if the symptoms worsen or if the condition fails to improve as anticipated.  Jamie Holder, [place]

## 2023-08-19 ENCOUNTER — Ambulatory Visit: Payer: 59 | Admitting: Behavioral Health

## 2023-08-19 DIAGNOSIS — F418 Other specified anxiety disorders: Secondary | ICD-10-CM

## 2023-08-19 NOTE — Progress Notes (Addendum)
[facility] Counselor/Therapist Progress Note  Patient ID: Soniyah Mcglory, MRN: [id number],    Date: 08/19/2023  Time Spent: 55 min In Person @ [facility] Time In: 9:00am Time Out: 9:55am  Treatment Type: Individual Therapy  Reported Symptoms: Reduction in anx/dep & stress w/Parenting since her medication is helping her control her anxiety & deal w/daily life stressors more positively. Cpl work has assisted marital stressors to reduce due to inc'd, healthy communication patterns.   Mental Status Exam: Appearance:  Casual     Behavior: Appropriate and Sharing  Motor: Normal  Speech/Language:  Clear and Coherent  Affect: Appropriate  Mood: anxious  Thought process: normal  Thought content:   WNL  Sensory/Perceptual disturbances:   WNL  Orientation: oriented to person, place, time/date, and situation  Attention: Good  Concentration: Good  Memory: WNL  Fund of knowledge:  Good  Insight:   Good  Judgment:  Good  Impulse Control: Good   Risk Assessment: Danger to Self:  No Self-injurious Behavior: No Danger to Others: No Duty to Warn:no Physical Aggression / Violence:No  Access to Firearms a concern: No  Gang Involvement:No   Subjective: Pt sts she is having success w/her new Vyvanse prescription. It is helping her control the constant thoughts in her head that lead to conversations w/herself & she is thinking much less about food. She feels happier & more out-going, she completes tasks, her bedtime routine w/the kids is smoother, she is enjoying the kids@ And she has cut off her nightly intake of wine. She enjoys having more patience w/herself & others.   Pt's Mother  (65yo Raynelle Fanning) is currently visiting, leaving today & has been a great help to Pt w/the children. [name] is starting [employer] soon w/Provider @ [employer] services [employer].  Pt & [name] have recently argued over their intimacy & both want to improve this area of the marriage w/inc'd  spontaneity. Pt agrees she can be more playful w/[name]-he is so goofy!  Interventions: Cognitive Behavioral Therapy and Solution-Oriented/Positive Psychology  Diagnosis:Depression with anxiety  Plan: Lucerito will cont to monitor her progress w/the Vyvanse. She is satisfied w/her progress @ the moment. We will re-eval the psychotherapy process next visit & see the direction she would like to take currently for Cpl th.  Target Date: 09/13/2023  Progress: 8  Frequency: Once every 2-3 wks  Modality: [name], LMFT

## 2023-08-19 NOTE — Progress Notes (Signed)
                Anastasya Jewell L Farryn Linares, LMFT 

## 2023-08-20 ENCOUNTER — Other Ambulatory Visit: Payer: Self-pay | Admitting: Physician Assistant

## 2023-08-21 ENCOUNTER — Other Ambulatory Visit (HOSPITAL_COMMUNITY): Payer: Self-pay

## 2023-08-21 ENCOUNTER — Other Ambulatory Visit: Payer: Self-pay

## 2023-08-21 MED ORDER — SPIRONOLACTONE 50 MG PO TABS
50.0000 mg | ORAL_TABLET | Freq: Two times a day (BID) | ORAL | 1 refills | Status: DC
  Filled 2023-08-21: qty 180, 90d supply, fill #0
  Filled 2023-11-17: qty 180, 90d supply, fill #1

## 2023-08-31 IMAGING — US US BREAST*R* LIMITED INC AXILLA
1 series · 6 of 6 positions shown · non-contrast
Comparison: Previous exam(s).
COMPARISON: None

*** End of Addendum ***
COMPARISON: Previous exam(s).

Addendum:
CLINICAL DATA: Palpable lump in the right breast after breast
reduction 6 months ago.

EXAM:
DIGITAL DIAGNOSTIC BILATERAL MAMMOGRAM WITH TOMOSYNTHESIS AND CAD;
ULTRASOUND RIGHT BREAST LIMITED
TECHNIQUE: Bilateral digital diagnostic mammography and breast tomosynthesis
was performed. The images were evaluated with computer-aided
detection.; Targeted ultrasound examination of the right breast was
performed

[Series 1: us breast*right* limited inc axilla · 0.06mm/px · 6 of 6 slices shown]
[im 1/6]
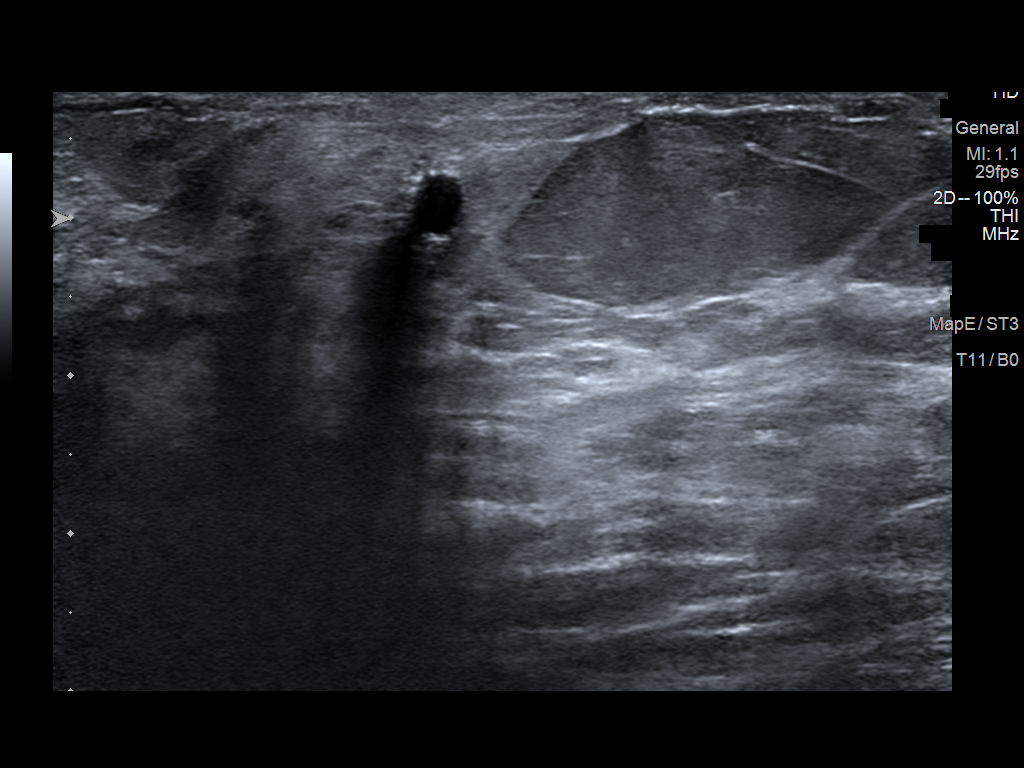
[im 2/6]
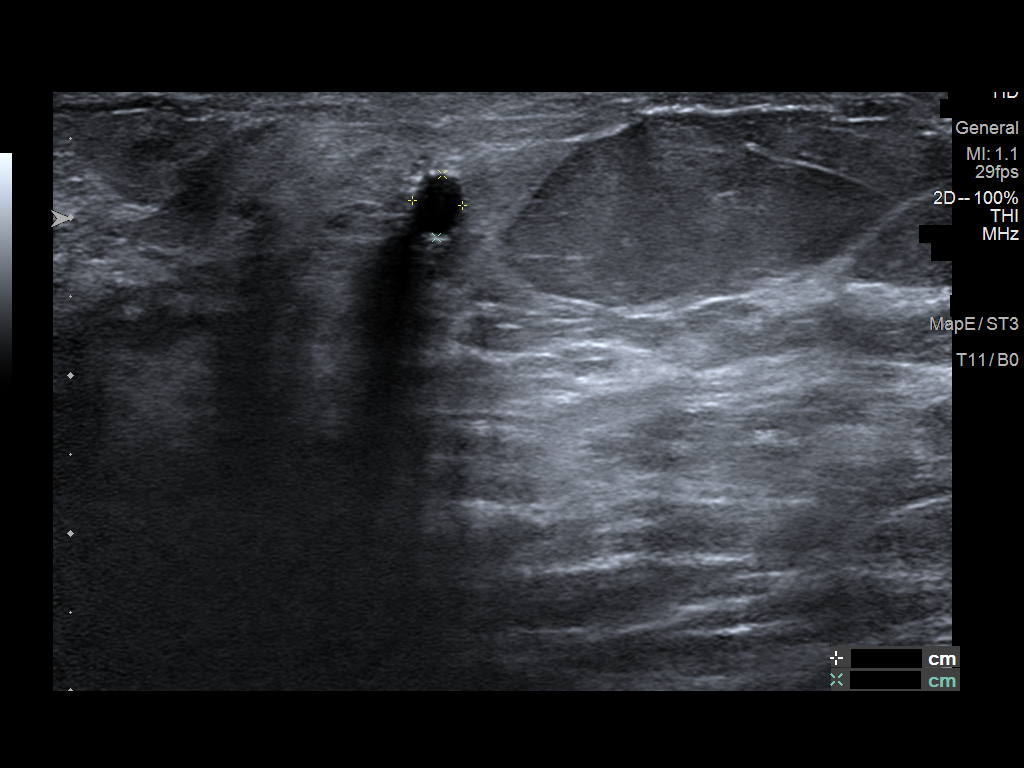
[im 3/6]
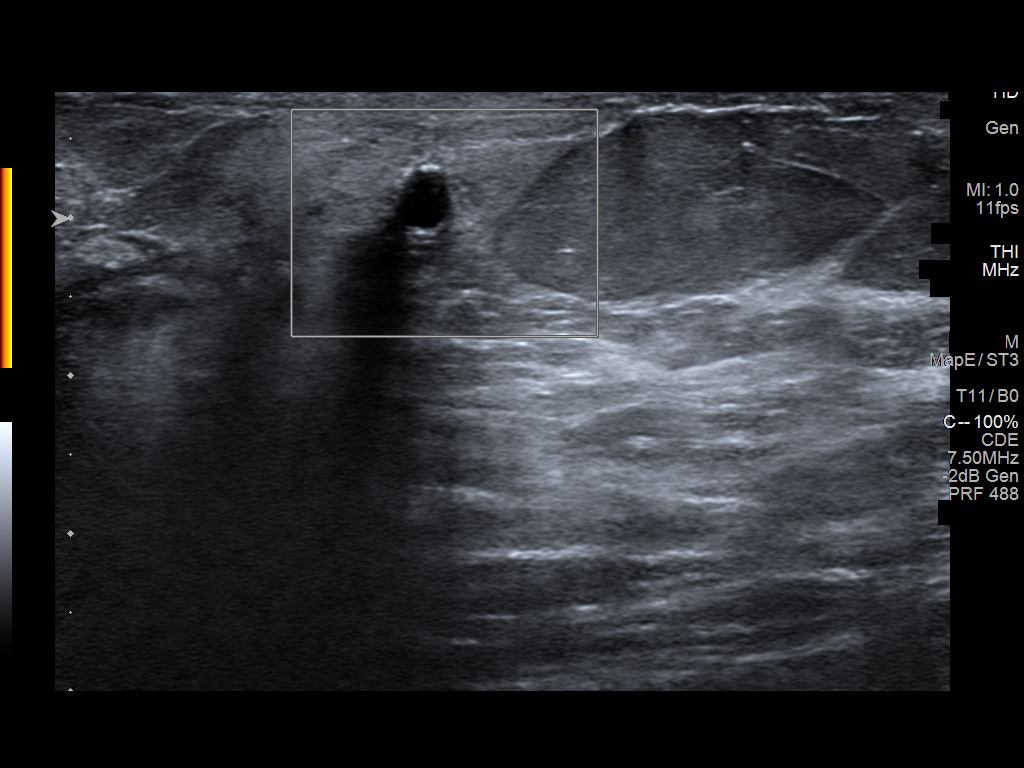
[im 4/6]
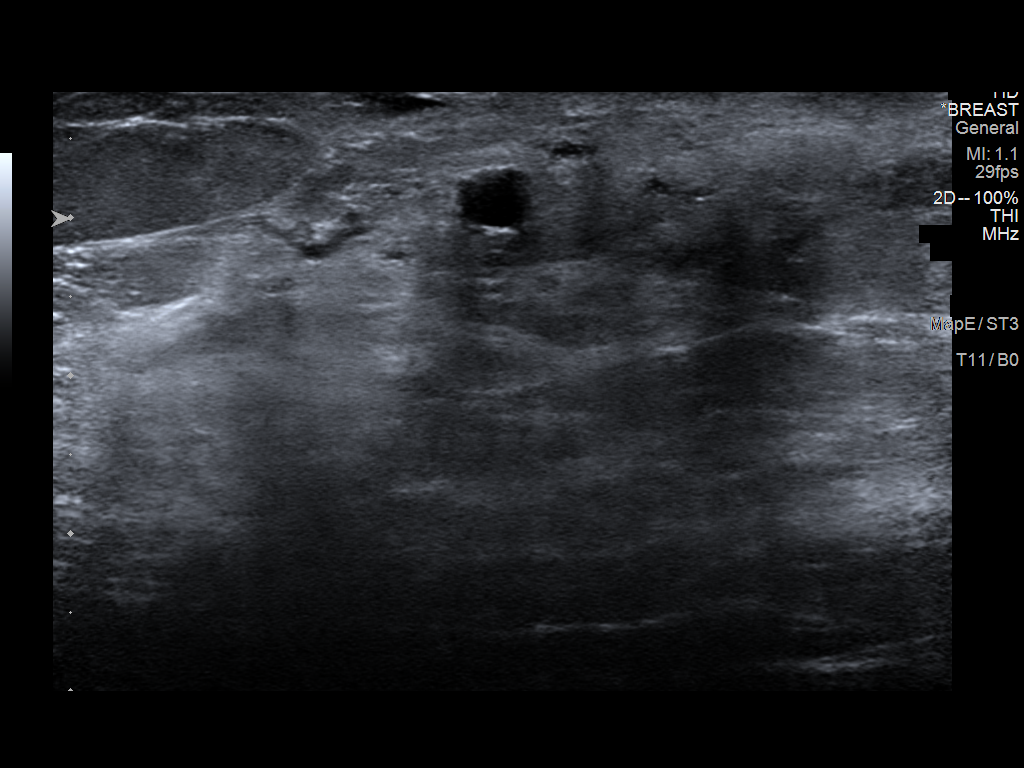
[im 5/6]
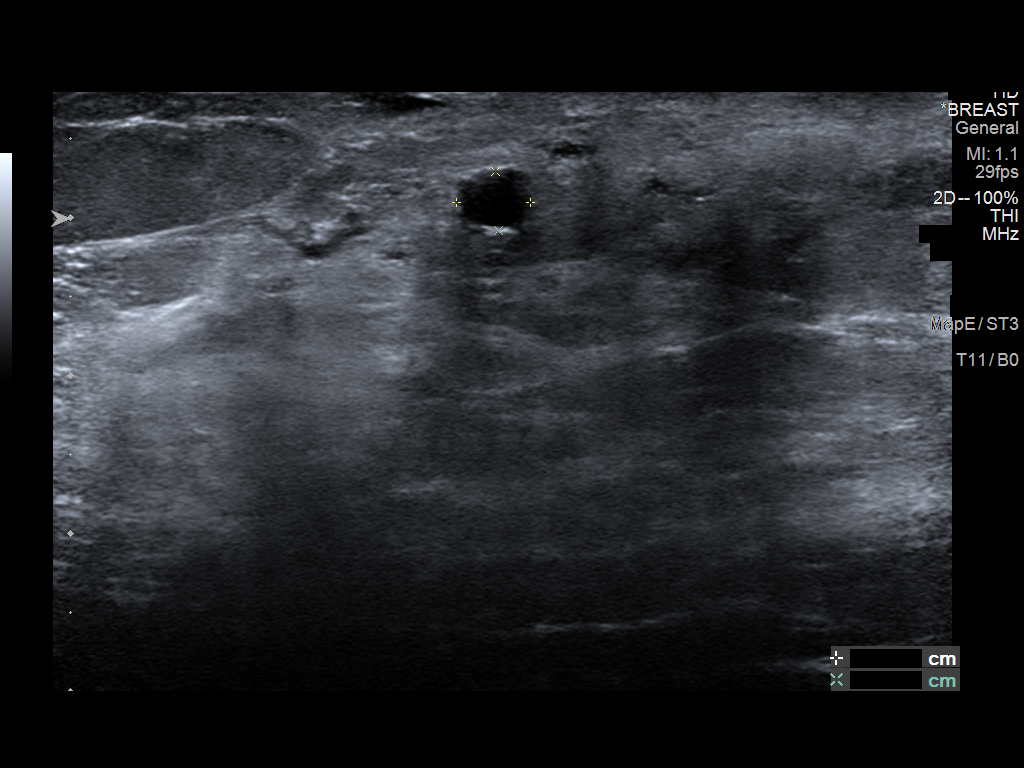
[im 6/6]
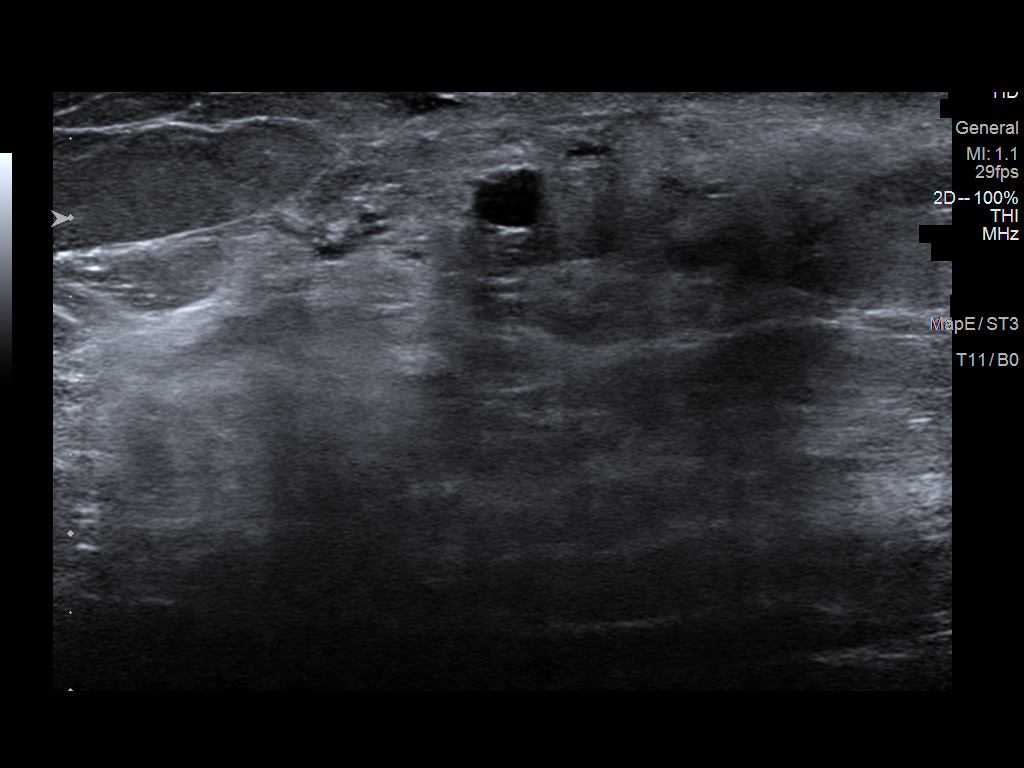

[6 of 6 positions shown; findings below may reference images not displayed]

ACR Breast Density Category c: The breast tissue is heterogeneously
dense, which may obscure small masses.
FINDINGS: There is fat necrosis in the region of the patient's lump. No
suspicious masses.

Targeted ultrasound is performed, showing fat necrosis including
increased echogenicity in the fat and an oil cyst at the site of the
patient's lump.
IMPRESSION: Fat necrosis.  No evidence of malignancy.

RECOMMENDATION:
Annual screening mammography beginning at the age of 40.

I have discussed the findings and recommendations with the patient.
If applicable, a reminder letter will be sent to the patient
regarding the next appointment.

BI-RADS CATEGORY  2: Benign.
ACR Breast Density Category c: The breast tissue is heterogeneously
dense, which may obscure small masses.
FINDINGS: There is fat necrosis in the region of the patient's lump. No
suspicious masses.

Targeted ultrasound is performed, showing fat necrosis including
increased echogenicity in the fat and an oil cyst at the site of the
patient's lump.
IMPRESSION: Fat necrosis.  No evidence of malignancy.

RECOMMENDATION:
Annual screening mammography beginning at the age of 40.

I have discussed the findings and recommendations with the patient.
If applicable, a reminder letter will be sent to the patient
regarding the next appointment.

BI-RADS CATEGORY  2: Benign.

## 2023-08-31 IMAGING — MG DIGITAL DIAGNOSTIC BILAT W/ TOMO W/ CAD
6 of 10 series · 6 of 30 positions shown · non-contrast
Comparison: Previous exam(s).
COMPARISON: None

*** End of Addendum ***
COMPARISON: Previous exam(s).

Addendum:
CLINICAL DATA: Palpable lump in the right breast after breast
reduction 6 months ago.

EXAM:
DIGITAL DIAGNOSTIC BILATERAL MAMMOGRAM WITH TOMOSYNTHESIS AND CAD;
ULTRASOUND RIGHT BREAST LIMITED
TECHNIQUE: Bilateral digital diagnostic mammography and breast tomosynthesis
was performed. The images were evaluated with computer-aided
detection.; Targeted ultrasound examination of the right breast was
performed

[R CC synth-2D]
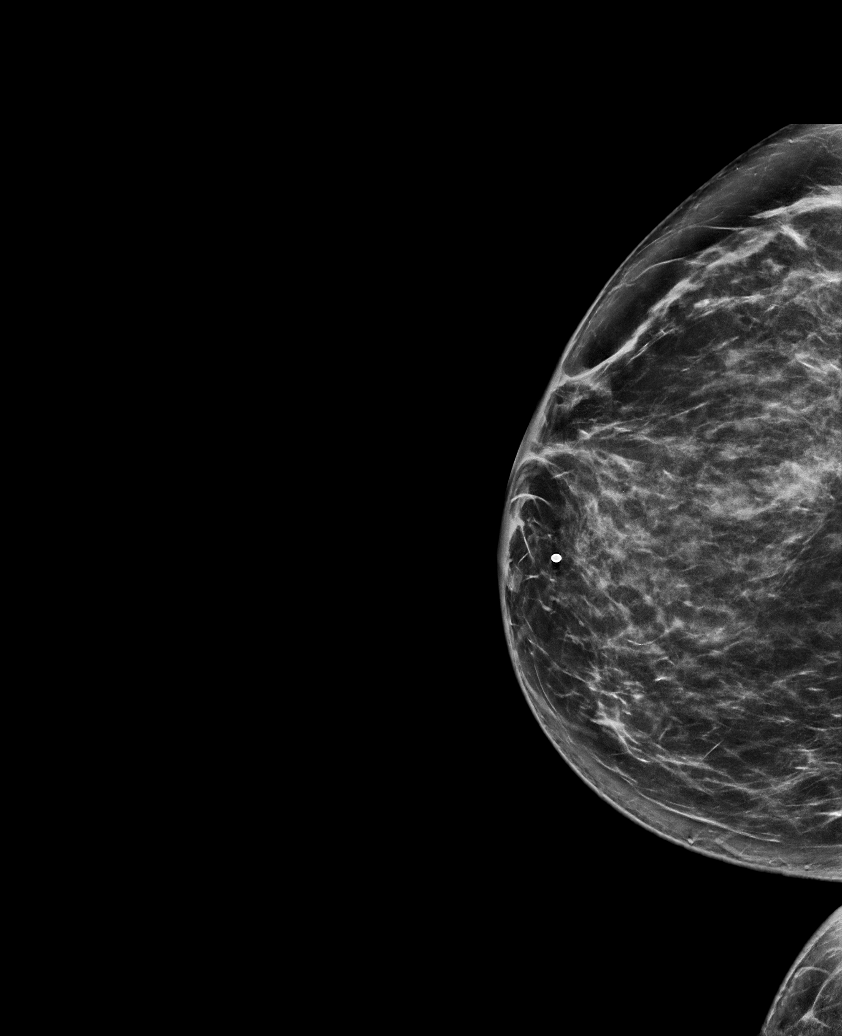

[R TAN synth-2D]
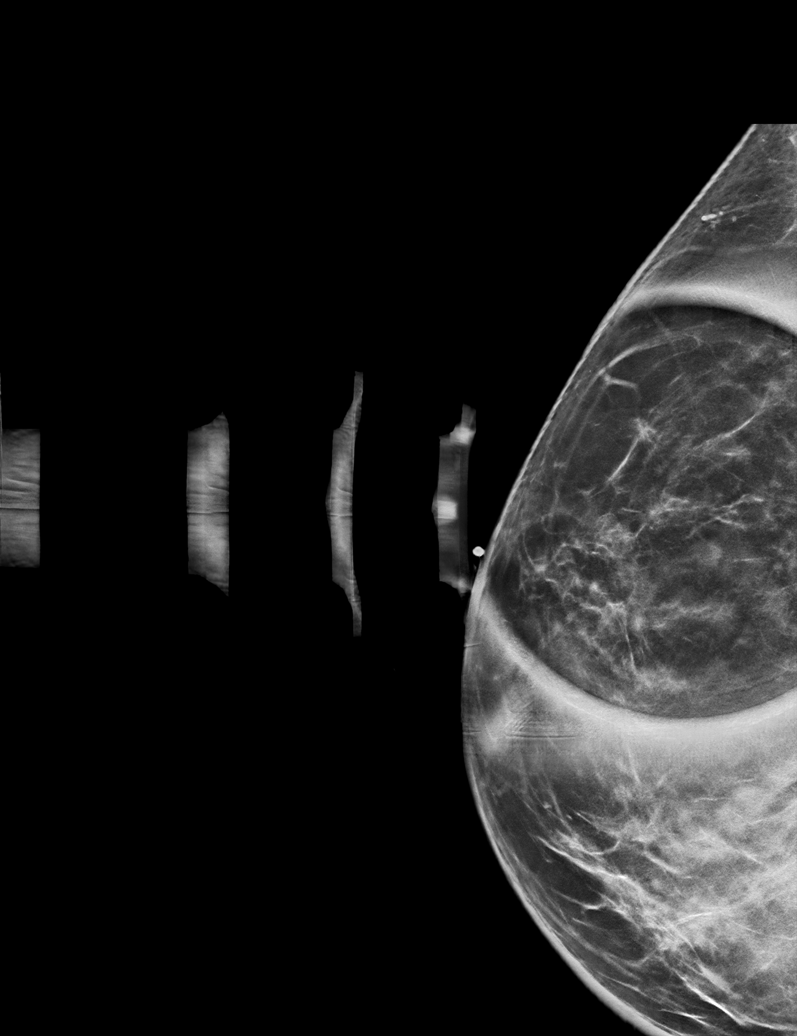

[R MLO synth-2D]
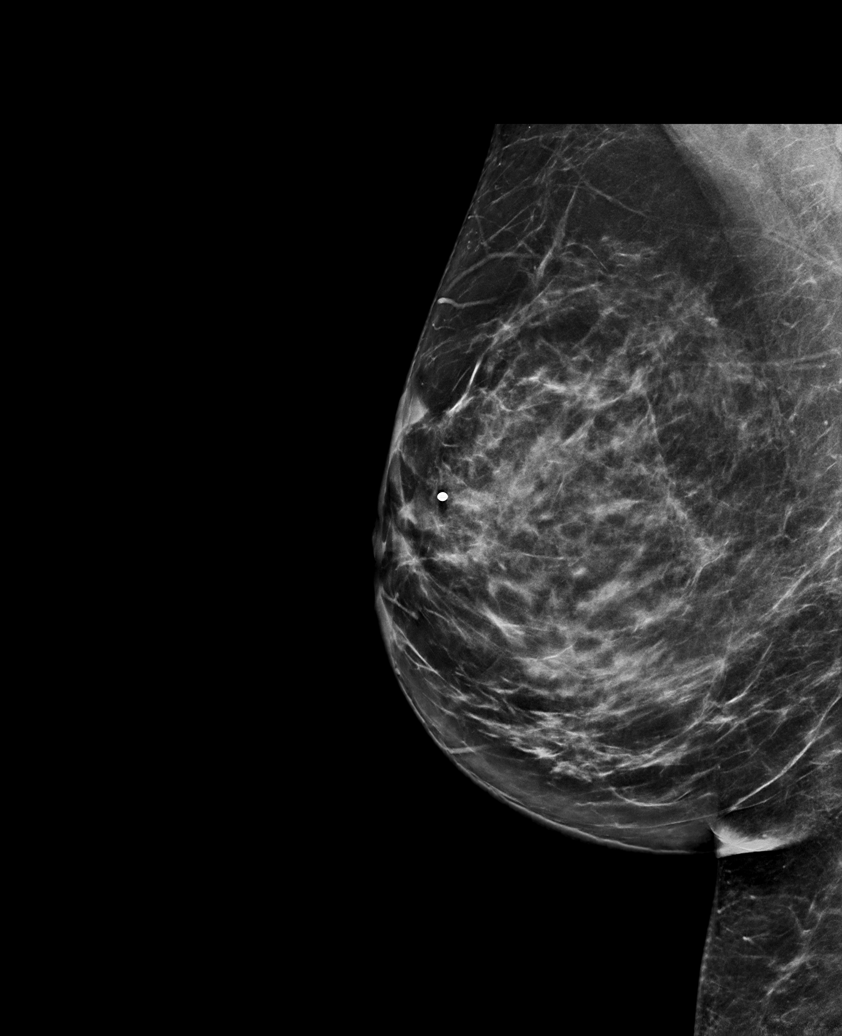

[L CC synth-2D]
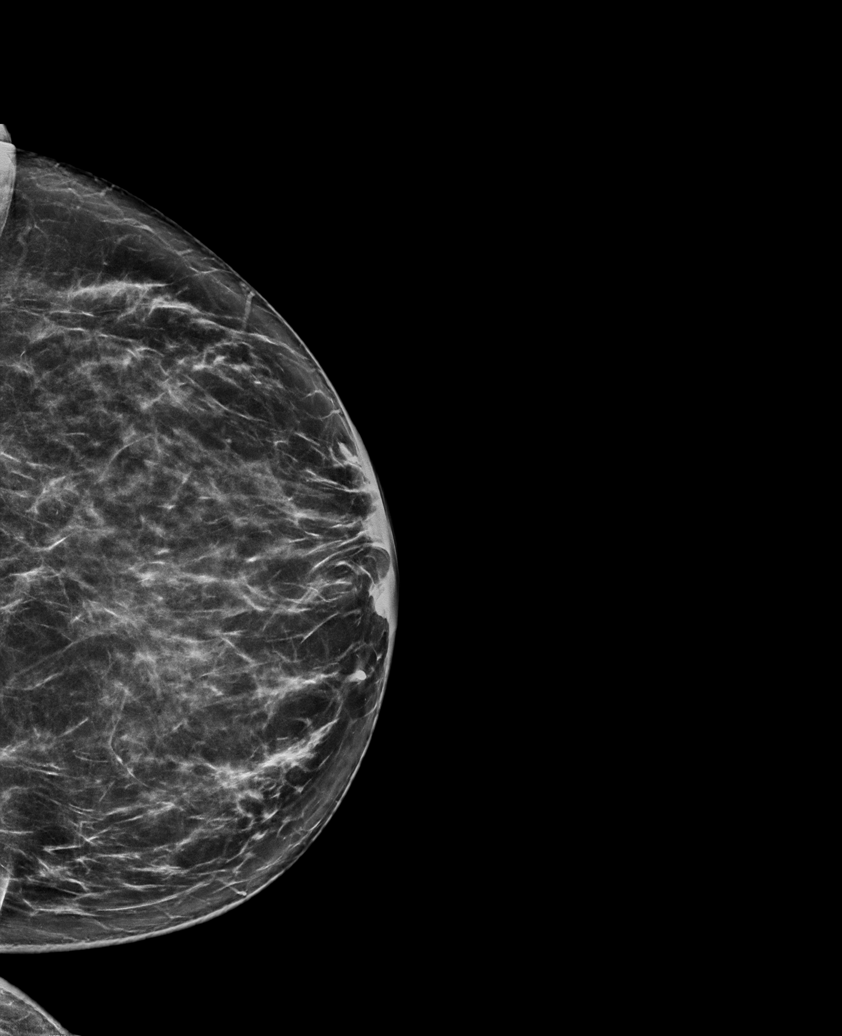

[L MLO synth-2D]
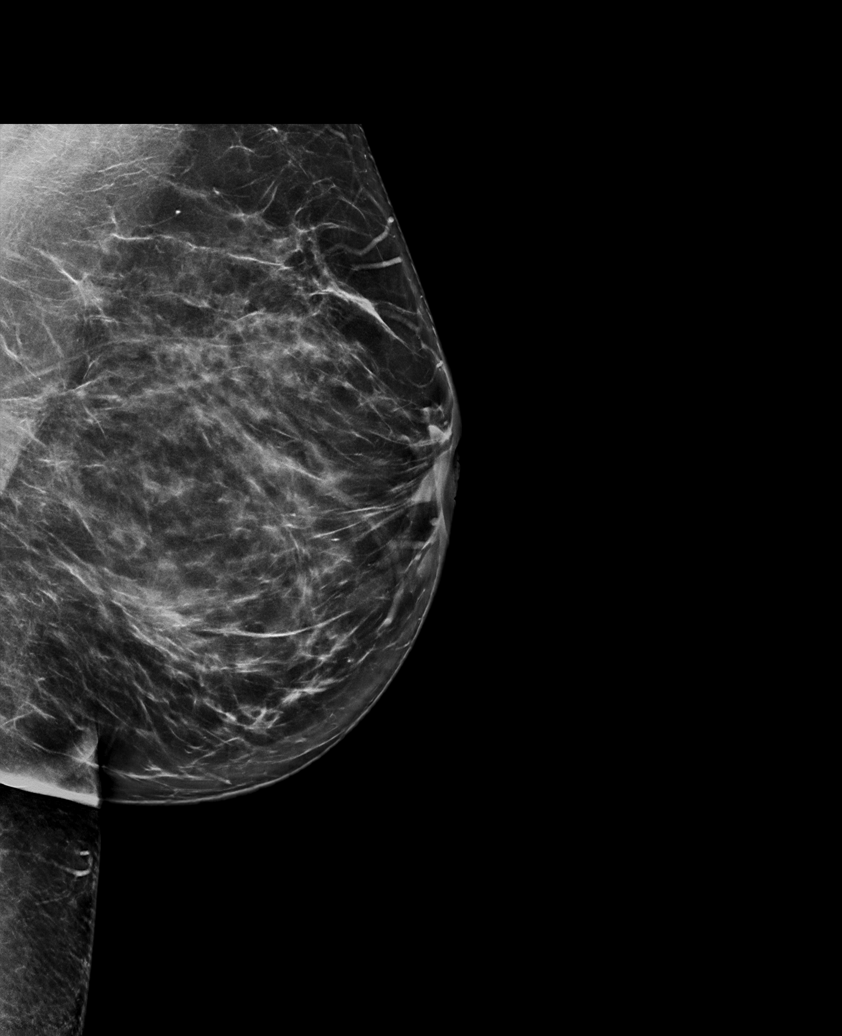

[L CC tomo · tomo slice 39/77.0]
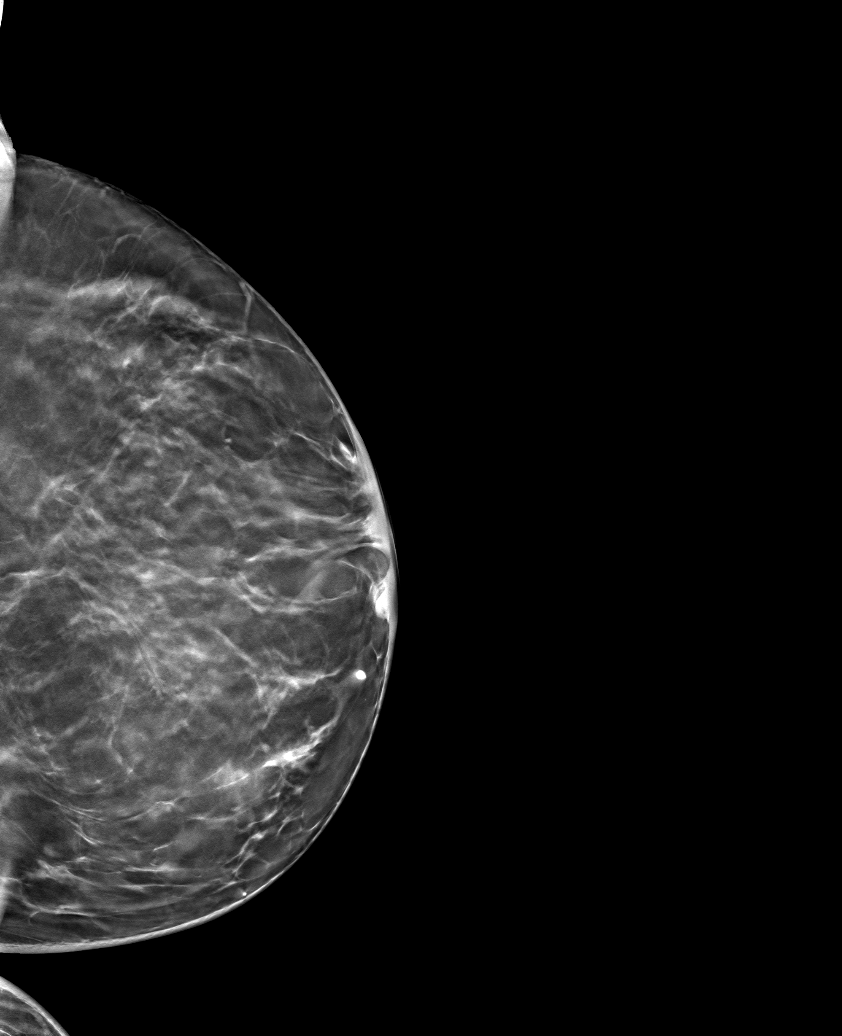

[6 of 30 positions shown; findings below may reference images not displayed]

ACR Breast Density Category c: The breast tissue is heterogeneously
dense, which may obscure small masses.
FINDINGS: There is fat necrosis in the region of the patient's lump. No
suspicious masses.

Targeted ultrasound is performed, showing fat necrosis including
increased echogenicity in the fat and an oil cyst at the site of the
patient's lump.
IMPRESSION: Fat necrosis.  No evidence of malignancy.

RECOMMENDATION:
Annual screening mammography beginning at the age of 40.

I have discussed the findings and recommendations with the patient.
If applicable, a reminder letter will be sent to the patient
regarding the next appointment.

BI-RADS CATEGORY  2: Benign.
ACR Breast Density Category c: The breast tissue is heterogeneously
dense, which may obscure small masses.
FINDINGS: There is fat necrosis in the region of the patient's lump. No
suspicious masses.

Targeted ultrasound is performed, showing fat necrosis including
increased echogenicity in the fat and an oil cyst at the site of the
patient's lump.
IMPRESSION: Fat necrosis.  No evidence of malignancy.

RECOMMENDATION:
Annual screening mammography beginning at the age of 40.

I have discussed the findings and recommendations with the patient.
If applicable, a reminder letter will be sent to the patient
regarding the next appointment.

BI-RADS CATEGORY  2: Benign.

## 2023-09-08 ENCOUNTER — Telehealth: Payer: Self-pay | Admitting: Physician Assistant

## 2023-09-08 NOTE — Telephone Encounter (Signed)
 Pt is wanting to schedule another visit with [facility]. Please advise.

## 2023-09-09 ENCOUNTER — Ambulatory Visit: Payer: 59 | Admitting: Behavioral Health

## 2023-09-09 DIAGNOSIS — F4323 Adjustment disorder with mixed anxiety and depressed mood: Secondary | ICD-10-CM

## 2023-09-09 DIAGNOSIS — F418 Other specified anxiety disorders: Secondary | ICD-10-CM | POA: Diagnosis not present

## 2023-09-09 NOTE — Progress Notes (Addendum)
[facility] Counselor/Therapist Progress Note  Patient ID: Jamie Holder, MRN: [id number],    Date: 09/09/2023  Time Spent: 55 min Caregility video; Pt is home in private & Provider working remotely from [facility] Office Time In: 9:00am Time Out: 9:55am   Treatment Type: Individual Therapy  Reported Symptoms: Pt has been handling a sick household w/COVID-19  Mental Status Exam: Appearance:  Casual     Behavior: Appropriate and Sharing  Motor: Normal  Speech/Language:  Clear and Coherent  Affect: Appropriate  Mood: euthymic  Thought process: normal  Thought content:   WNL  Sensory/Perceptual disturbances:   WNL  Orientation: oriented to person, place, time/date, and situation  Attention: Good  Concentration: Good  Memory: WNL  Fund of knowledge:  Good  Insight:   Good  Judgment:  Good  Impulse Control: Good   Risk Assessment: Danger to Self:  No Self-injurious Behavior: No Danger to Others: No Duty to Warn:no Physical Aggression / Violence:No  Access to Firearms a concern: No  Gang Involvement:No   Subjective: Pt is home w/her children today who are working remotely for [employer]. They both attend the same Elementary Sch & the Teachers know them both. Kids' health is on the upswing. She has been holding her Jamie Holder through the night so he can sleep. He will join the Family bed almost nightly.   The Cpl is trying to be thoughtful about ea other's needs more so than in the past. Pt has stood her ground & advocated for her Dtr in the Sch setting. She stood her ground about her anxiety, advocated for herself, & her Family, both w/her [name] & @ the Sch so Dtr Jamie Holder could participate in a performance.  As they move forward, Pt is interested in more Cpl Th work.   Interventions: Insight-Oriented and Family Systems  Diagnosis:Depression with anxiety  Adjustment disorder with mixed anxiety and depressed mood  Plan: Brandi is interested in Cpl Th  sessions. She sees vacation time as a hard transition for the Family as they try to regain the Family's equilibrium. She does want to inc their communication efficiency as it has improved & she wants to do more. She hopes her [name] Jamie Holder can get more comfortable w/sharing his feelings & discussing more intimately the things that are essential to their relationship. We will use the Cpl Paper Exercise to share more intimately in our next session.   Target Date: 10/26/2023  Progress: 7  Frequency: Once monthly or as schedules dictate  Modality: Jamie Holder will initiate the 'Strangers @ Dinner' Family exercise at the Dining Table when everyone feels back to 100% & she can try a fun activity.  Target Date: 10/26/2023  Progress: 0  Frequency: Once every 2-3 wks  Modality: Jamie Fraise, LMFT

## 2023-09-09 NOTE — Progress Notes (Signed)
Deneise Lever, LMFT

## 2023-09-15 ENCOUNTER — Encounter: Payer: Self-pay | Admitting: Physician Assistant

## 2023-09-15 ENCOUNTER — Encounter (HOSPITAL_COMMUNITY): Payer: Self-pay

## 2023-09-15 ENCOUNTER — Other Ambulatory Visit: Payer: Self-pay

## 2023-09-15 ENCOUNTER — Other Ambulatory Visit (HOSPITAL_COMMUNITY): Payer: Self-pay

## 2023-09-15 ENCOUNTER — Other Ambulatory Visit: Payer: Self-pay | Admitting: Physician Assistant

## 2023-09-15 MED ORDER — LISDEXAMFETAMINE DIMESYLATE 60 MG PO CAPS
60.0000 mg | ORAL_CAPSULE | Freq: Every morning | ORAL | 0 refills | Status: DC
  Filled 2023-09-15: qty 30, 30d supply, fill #0

## 2023-09-15 MED ORDER — MONTELUKAST SODIUM 10 MG PO TABS
10.0000 mg | ORAL_TABLET | Freq: Every day | ORAL | 3 refills | Status: AC
  Filled 2023-09-15 – 2023-12-22 (×2): qty 90, 90d supply, fill #0
  Filled 2024-03-18: qty 90, 90d supply, fill #1
  Filled 2024-06-21: qty 90, 90d supply, fill #2

## 2023-09-15 NOTE — Telephone Encounter (Signed)
Pt requesting refill for Vyvanse 60 mg. Last OV 08/11/2023.

## 2023-09-16 ENCOUNTER — Other Ambulatory Visit (HOSPITAL_COMMUNITY): Payer: Self-pay

## 2023-09-16 ENCOUNTER — Other Ambulatory Visit: Payer: Self-pay

## 2023-10-08 ENCOUNTER — Other Ambulatory Visit (HOSPITAL_COMMUNITY): Payer: Self-pay

## 2023-10-13 ENCOUNTER — Ambulatory Visit (INDEPENDENT_AMBULATORY_CARE_PROVIDER_SITE_OTHER): Payer: 59 | Admitting: Behavioral Health

## 2023-10-13 DIAGNOSIS — F418 Other specified anxiety disorders: Secondary | ICD-10-CM

## 2023-10-13 NOTE — Progress Notes (Signed)
   Jamie Lever, LMFT

## 2023-10-13 NOTE — Progress Notes (Signed)
 [facility] Counselor/Therapist Progress Note  Patient ID: Chan Rosasco, MRN: [id number],    Date: 10/13/2023  Time Spent: 60 min In Person @ [facility] Time In: 9:00am Time Out: 10:00am   Treatment Type:  Cpl Th  Reported Symptoms: Elevated anx/dep & stress due to raising 2 children ages 62 & 9 in a busy home w/schedules that are typical of a young Family   Mental Status Exam: Appearance:  Casual     Behavior: Appropriate and Sharing  Motor: Normal  Speech/Language:  Clear and Coherent and Normal Rate  Affect: Appropriate  Mood: normal  Thought process: normal  Thought content:   WNL  Sensory/Perceptual disturbances:   WNL  Orientation: oriented to person, place, time/date, and situation  Attention: Good  Concentration: Good  Memory: WNL  Fund of knowledge:  Good  Insight:   Good  Judgment:  Good  Impulse Control: Good   Risk Assessment: Danger to Self:  No Self-injurious Behavior: No Danger to Others: No Duty to Warn:no Physical Aggression / Violence:No  Access to Firearms a concern: No  Gang Involvement:No   Subjective: Family got through a month of COVID-19 happening in the home. Parents went on week long trip to [place] 2 wks ago. There was a plumbing issue in their room @ the [employer]. This caused issues of moving rooms & [name] speaking up for them w/Mgmt. [name] wanted his anger to be acknowledged & his advocacy appreciated. Pt took view of keeping perspective & situation would work out.  [name] has an agenda. He does not understand Wife's issue w/procrastination & low motivation. He wants her to care for herself & be firmer w/the children. Pt addressed [name] anger w/her & how he expresses this, or not. On a scale of 1-100, they reach a max of 40 & [name] wants to reach 60. The scale is 100% to War Zone circumstances. He becomes frustrated when he cannot sit in a clean home, free of children's toys & messes when he gets home @  night.   Interventions: Solution-Oriented/Positive Psychology and Family Systems; used a Psychologist, prison and probation services for the home setting. Children cause GI issues daily & Parents have to roll w/the situation; diarrhea, constipation, or smooth sailing. They will use ea other's strengths to be a unified front w/the children. Amelia needs hand-holding & Dad wants her to transition away from this intense need. Jerilynn Som is more disciplined & even helps his older Str @ times w/her morning routine.  Diagnosis:Depression with anxiety  Plan: Cpl will sit down today & write out a POA they will fllw. It will incorporate 'feedback loops' to encourage the children's learning & make provisions for setbacks. Cpl will write the POA down on paper so it can easily be revised.   Target Date: 10/28/2023  Progress: 6  Frequency: Once every 2-3 wks  Modality: Cpl Th  Deneise Lever, LMFT

## 2023-10-19 ENCOUNTER — Other Ambulatory Visit: Payer: Self-pay | Admitting: Physician Assistant

## 2023-10-20 ENCOUNTER — Other Ambulatory Visit (HOSPITAL_COMMUNITY): Payer: Self-pay

## 2023-10-20 ENCOUNTER — Other Ambulatory Visit: Payer: Self-pay

## 2023-10-20 MED ORDER — LISDEXAMFETAMINE DIMESYLATE 60 MG PO CAPS
60.0000 mg | ORAL_CAPSULE | Freq: Every morning | ORAL | 0 refills | Status: DC
  Filled 2023-10-20: qty 30, 30d supply, fill #0

## 2023-10-20 NOTE — Telephone Encounter (Signed)
 Pt requesting refill for Vyvanse 60 mg. Last OV 07/2023.

## 2023-10-21 ENCOUNTER — Other Ambulatory Visit (HOSPITAL_COMMUNITY): Payer: Self-pay

## 2023-10-21 ENCOUNTER — Encounter: Payer: Self-pay | Admitting: Pharmacist

## 2023-10-21 ENCOUNTER — Other Ambulatory Visit: Payer: Self-pay

## 2023-11-03 ENCOUNTER — Ambulatory Visit (INDEPENDENT_AMBULATORY_CARE_PROVIDER_SITE_OTHER): Admitting: Behavioral Health

## 2023-11-03 DIAGNOSIS — F418 Other specified anxiety disorders: Secondary | ICD-10-CM

## 2023-11-03 NOTE — Progress Notes (Signed)
 [facility] Counselor/Therapist Progress Note  Patient ID: Jamie Holder, MRN: [id number],    Date: 11/03/2023  Time Spent: 50 min In Person @ [facility] Time In: 9:00am Time Out: 9:50am   Treatment Type: Individual Therapy  Reported Symptoms: Elevated anx/dep & stress due to [name] recent wk of vacation & recognition of issues w/ETOH consumption  Mental Status Exam: Appearance:  Casual     Behavior: Appropriate and Sharing  Motor: Normal  Speech/Language:  Clear and Coherent  Affect: Appropriate  Mood: normal  Thought process: normal  Thought content:   WNL  Sensory/Perceptual disturbances:   WNL  Orientation: oriented to person, place, time/date, and situation  Attention: Good  Concentration: Good  Memory: WNL  Fund of knowledge:  Good  Insight:   Good  Judgment:  Good  Impulse Control: Good   Risk Assessment: Danger to Self:  No Self-injurious Behavior: No Danger to Others: No Duty to Warn:no Physical Aggression / Violence:No  Access to Firearms a concern: No  Gang Involvement:No   Subjective: Pt is concerned for [name] use of ETOH currently. He was off work last week & did a lot of drinking. Pt recounts this pattern of use since College & always keeping [name] on track about his drinking. Last week was excessive & she worries for his health & good choices. They argued about it over the wknd & she wants resources & guidance today.    [name] has attended [facility] 3 times w/his Fr. This is good support. Pt feels he needs Therapeutic support as well.   Interventions: Family Systems and Recovery Support/Relapse Prevention  Diagnosis:Depression with anxiety  Plan: [name] provided psychoedu re: Recovery Support/Relapse Prevention, Moderation, AA Mtgs @ [facility] [employer] [facility]. Tx resources via ADS & TRC. We discussed how to d/c enabling beh on [name]'s part & protect the children from getting Dad a beer. We will meet tgthr next visit  to help support the Cpl in this journey.  Target Date: 11/11/2023  Progress: 7  Frequency: Once every 2-3 wks  Modality: [name], LMFT

## 2023-11-03 NOTE — Progress Notes (Signed)
   Jamie Lever, LMFT

## 2023-11-05 ENCOUNTER — Other Ambulatory Visit (HOSPITAL_COMMUNITY): Payer: Self-pay

## 2023-11-08 ENCOUNTER — Other Ambulatory Visit (HOSPITAL_COMMUNITY): Payer: Self-pay

## 2023-11-17 ENCOUNTER — Other Ambulatory Visit: Payer: Self-pay | Admitting: Physician Assistant

## 2023-11-18 ENCOUNTER — Other Ambulatory Visit (HOSPITAL_COMMUNITY): Payer: Self-pay

## 2023-11-18 ENCOUNTER — Other Ambulatory Visit: Payer: Self-pay

## 2023-11-18 NOTE — Telephone Encounter (Signed)
 Pt requesting refill for Vyvanse 60 mg. Last OV 08/11/2023.

## 2023-11-19 ENCOUNTER — Other Ambulatory Visit (HOSPITAL_COMMUNITY): Payer: Self-pay

## 2023-11-19 MED ORDER — LISDEXAMFETAMINE DIMESYLATE 60 MG PO CAPS
60.0000 mg | ORAL_CAPSULE | Freq: Every morning | ORAL | 0 refills | Status: DC
  Filled 2023-11-19: qty 30, 30d supply, fill #0

## 2023-11-27 ENCOUNTER — Ambulatory Visit: Admitting: Behavioral Health

## 2023-12-04 ENCOUNTER — Encounter (HOSPITAL_COMMUNITY): Payer: Self-pay

## 2023-12-11 ENCOUNTER — Encounter: Payer: Self-pay | Admitting: Physician Assistant

## 2023-12-11 ENCOUNTER — Other Ambulatory Visit (HOSPITAL_COMMUNITY): Payer: Self-pay

## 2023-12-11 ENCOUNTER — Other Ambulatory Visit: Payer: Self-pay

## 2023-12-11 ENCOUNTER — Telehealth: Admitting: Physician Assistant

## 2023-12-11 VITALS — Ht 66.0 in | Wt 178.0 lb

## 2023-12-11 DIAGNOSIS — F50819 Binge eating disorder, unspecified: Secondary | ICD-10-CM | POA: Diagnosis not present

## 2023-12-11 MED ORDER — LISDEXAMFETAMINE DIMESYLATE 60 MG PO CAPS
60.0000 mg | ORAL_CAPSULE | ORAL | 0 refills | Status: DC
  Filled 2023-12-11 – 2023-12-22 (×4): qty 30, 30d supply, fill #0

## 2023-12-11 MED ORDER — LISDEXAMFETAMINE DIMESYLATE 60 MG PO CAPS
60.0000 mg | ORAL_CAPSULE | ORAL | 0 refills | Status: DC
  Filled 2024-02-16 – 2024-02-25 (×3): qty 30, 30d supply, fill #0
  Filled ????-??-??: fill #0

## 2023-12-11 MED ORDER — LISDEXAMFETAMINE DIMESYLATE 60 MG PO CAPS
60.0000 mg | ORAL_CAPSULE | ORAL | 0 refills | Status: DC
  Filled 2024-01-26: qty 30, 30d supply, fill #0

## 2023-12-11 NOTE — Progress Notes (Signed)
 I acted as a Neurosurgeon for [employer], PA-C Jerilyn Monte, LPN  Virtual Visit via Video Note   I, Alexander Iba, PA, connected with  Jamie Holder  ([id number], [birth year]) on 12/11/23 at  8:00 AM EDT by a video-enabled telemedicine application and verified that I am speaking with the correct person using two identifiers.  Location: Patient: Home Provider: [facility] office   I discussed the limitations of evaluation and management by telemedicine and the availability of in person appointments. The patient expressed understanding and agreed to proceed.    History of Present Illness: Jamie Holder is a 38 y.o. who identifies as a female who was assigned female at birth, and is being seen today for binge eating. Pt is currently on Vyvanse  60 mg, tolerating medication well and pt states is helping with binge eating. Denies issues with worsening anxiety with medication, insomnia or any other side effect(s).  Does not feel like a dose change is needed.   Problems:  Patient Active Problem List   Diagnosis Date Noted   Acute cholecystitis 09/17/2022   Anxiety 11/13/2020   Elevated blood pressure reading 02/04/2020   Gastroesophageal reflux disease 02/04/2020   Obesity 02/04/2020    Allergies: No Known Allergies Medications:  Current Outpatient Medications:    cetirizine (ZYRTEC) 10 MG chewable tablet, Chew 10 mg by mouth daily., Disp: , Rfl:    FLUoxetine  (PROZAC ) 20 MG capsule, Take 1 capsule (20 mg total) by mouth daily., Disp: 90 capsule, Rfl: 3   FLUoxetine  (PROZAC ) 40 MG capsule, Take 1 capsule (40 mg total) by mouth daily., Disp: 90 capsule, Rfl: 3   lisdexamfetamine (VYVANSE ) 60 MG capsule, Take 1 capsule (60 mg total) by mouth every morning., Disp: 30 capsule, Rfl: 0   [START ON 01/10/2024] lisdexamfetamine (VYVANSE ) 60 MG capsule, Take 1 capsule (60 mg total) by mouth every morning., Disp: 30 capsule, Rfl: 0   [START ON 02/09/2024]  lisdexamfetamine (VYVANSE ) 60 MG capsule, Take 1 capsule (60 mg total) by mouth every morning., Disp: 30 capsule, Rfl: 0   montelukast  (SINGULAIR ) 10 MG tablet, Take 1 tablet (10 mg total) by mouth at bedtime., Disp: 90 tablet, Rfl: 3   norethindrone  (MICRONOR ) 0.35 MG tablet, Take 1 tablet (0.35 mg total) by mouth daily., Disp: 84 tablet, Rfl: 3   omeprazole  (PRILOSEC) 20 MG capsule, Take 1 capsule (20 mg total) by mouth daily., Disp: 90 capsule, Rfl: 3   spironolactone  (ALDACTONE ) 50 MG tablet, Take 1 tablet (50 mg total) by mouth 2 (two) times daily., Disp: 180 tablet, Rfl: 1  Observations/Objective: Patient is well-developed, well-nourished in no acute distress.  Resting comfortably  at home.  Head is normocephalic, atraumatic.  No labored breathing.  Speech is clear and coherent with logical content.  Patient is alert and oriented at baseline.    Assessment and Plan: 1. Binge eating disorder, unspecified severity (Primary) Well controlled PDMP reviewed during this encounter. Will continue current prescription of Vyvanse  60 mg daily Follow up in 3 months, sooner if concerns  Follow Up Instructions: I discussed the assessment and treatment plan with the patient. The patient was provided an opportunity to ask questions and all were answered. The patient agreed with the plan and demonstrated an understanding of the instructions.  A copy of instructions were sent to the patient via MyChart unless otherwise noted below.   The patient was advised to call back or seek an in-person evaluation if the symptoms worsen or if  the condition fails to improve as anticipated.  Alexander Iba, [place]

## 2023-12-12 ENCOUNTER — Other Ambulatory Visit (HOSPITAL_COMMUNITY): Payer: Self-pay

## 2023-12-22 ENCOUNTER — Other Ambulatory Visit (HOSPITAL_COMMUNITY): Payer: Self-pay

## 2023-12-23 ENCOUNTER — Other Ambulatory Visit (HOSPITAL_COMMUNITY): Payer: Self-pay

## 2023-12-23 DIAGNOSIS — Z0100 Encounter for examination of eyes and vision without abnormal findings: Secondary | ICD-10-CM | POA: Diagnosis not present

## 2023-12-23 DIAGNOSIS — H5213 Myopia, bilateral: Secondary | ICD-10-CM | POA: Diagnosis not present

## 2023-12-24 ENCOUNTER — Ambulatory Visit (INDEPENDENT_AMBULATORY_CARE_PROVIDER_SITE_OTHER): Admitting: Behavioral Health

## 2023-12-24 DIAGNOSIS — F418 Other specified anxiety disorders: Secondary | ICD-10-CM

## 2023-12-24 NOTE — Progress Notes (Signed)
 [facility] Counselor/Therapist Progress Note  Patient ID: Hibah Odonnell, MRN: [id number],    Date: 12/24/2023  Time Spent: 50 min Caregility video; Pt is home in private & Provider working remotely from [employer]. Pt is aware of the risks/limitations of telehealth & consents to Tx today. Time In: 9:00am Time Out: 9:50am   Treatment Type: Individual Therapy  Reported Symptoms: Elevated anx/dep & home stress due to the end of the year  Mental Status Exam: Appearance:  Casual     Behavior: Appropriate and Sharing  Motor: Normal  Speech/Language:  Clear and Coherent  Affect: Appropriate  Mood: normal  Thought process: normal  Thought content:   WNL  Sensory/Perceptual disturbances:   WNL  Orientation: oriented to person, place, time/date, and situation  Attention: Good  Concentration: Good  Memory: WNL  Fund of knowledge:  Good  Insight:   Good  Judgment:  Good  Impulse Control: Good   Risk Assessment: Danger to Self:  No Self-injurious Behavior: No Danger to Others: No Duty to Warn:no Physical Aggression / Violence:No  Access to Firearms a concern: No  Gang Involvement:No   Subjective: Pt is aware how challenging the End of the School Year is in the younger years. She is managing her Dtr's meltdowns & her Son's adjustment as he develops. Pt is working hard to be present for her children & her [name] as his work schedule is demanding. She has changed her expectations of herself & now realizes she was raised w/values that were strict & demanding.  38yo Amelia cannot successfully regulate her emotional system yet. Pt is holding her during "meltdowns" until she stops crying. Much of this results from over-stimulation. She also walks on tippy toe at times-this signals to Pt her Dtr may have some Autistic traits. The Pediatrician has not suggested testing at this time, although Pt has considered this may be needed. Encouraged Pt in the Psychologists  available @ [facility] who conduct Testing & the benefits to this for her Daughter.  Next week she & [name] are travelling to a venue in [place], [place] for their Anniversary & she looks forward to this trip that has become their yearly ritual. Pt is making efforts to communicate more effectively w/her [name] & to regulate what & how she says things so they can have positive conversations.   Interventions: Family Systems  Diagnosis:Depression with anxiety  Plan: For our next visit, Malya & her Leldon Push will attend the session together. We will do this in mid-June so the School Year can come to a close first; the children will be in [place] that week & [name] will have time off. As [name] use of ETOH becomes more manageable, Li will keep in mind she can attend Al-Anon as a Spouse with concerns & benefit from the Grp setting. Shalee will consider the messages she wants to promote in the home for her children, such as 'Do your best', 'Beat your own best time/score' & other similar lessons that do not encourage perfectionism or feelings of not being enough.  Target Date: 01/26/2024  Progress: 8  Frequency: Once every 2-3 wks as schedule allows  Modality: Deborrah Fam, LMFT

## 2023-12-24 NOTE — Progress Notes (Signed)
   Deneise Lever, LMFT

## 2024-01-12 ENCOUNTER — Ambulatory Visit: Admitting: Behavioral Health

## 2024-01-12 DIAGNOSIS — F418 Other specified anxiety disorders: Secondary | ICD-10-CM | POA: Diagnosis not present

## 2024-01-12 NOTE — Progress Notes (Signed)
   Deneise Lever, LMFT

## 2024-01-12 NOTE — Progress Notes (Signed)
 [facility] Counselor/Therapist Progress Note  Patient ID: Jamie Holder, MRN: [id number],    Date: 03/17/2024  Time Spent: 55 min Caregility video; Cpl is home in private & Provider working remotely from [employer]. Cpl is aware of the risks/limitations of telehealth & consents to Tx today.  Time In: 11:00am Time Out: 11:55am  Treatment Type: Cpl Th  Reported Symptoms: Elevated anx/dep & stress due to Family dvlpmt issues, Parenting challenges, & adjustment to changing needs of both children.   Mental Status Exam: Appearance:  Casual     Behavior: Appropriate and Sharing  Motor: Normal  Speech/Language:  Clear and Coherent  Affect: Appropriate  Mood: normal  Thought process: normal  Thought content:   WNL  Sensory/Perceptual disturbances:   WNL  Orientation: oriented to person, place, time/date, and situation  Attention: Good  Concentration: Good  Memory: WNL  Fund of knowledge:  Good  Insight:   Good  Judgment:  Good  Impulse Control: Good   Risk Assessment: Danger to Self:  No Self-injurious Behavior: No Danger to Others: No Duty to Warn:no Physical Aggression / Violence:No  Access to Firearms a concern: No  Gang Involvement:No   Subjective: Jamie Holder Share both present today. They enjoyed their Anniversary celebration; it was quiet & relaxing. Share reports he had elevated liver function studies @ his last Px & he made it a point to cut back on consumption of ETOH. He has been deliberate ever since. Jamie Holder is very proud of these efforts on his part.    Interventions: Family Systems  Diagnosis:Depression with anxiety  Plan: Tana Trefry will secure a box of 'Yoga Pretzels' to play w/the kids this Summer. The Family will get busy & stay busy. This will also benefit both Parents. They will explore [email or web address] for themselves & the Family. The Family is pushing the reset button & we will work more closely as Sch starts again in the  Fall.   Target Date: 03/27/2024  Progress: 7  Frequency: Once monthly prn until Fall  Modality: Cpl Th  Richerd LITTIE Ling, LMFT

## 2024-01-13 ENCOUNTER — Other Ambulatory Visit: Payer: Self-pay

## 2024-01-21 ENCOUNTER — Other Ambulatory Visit (HOSPITAL_COMMUNITY): Payer: Self-pay

## 2024-01-21 ENCOUNTER — Telehealth: Payer: Self-pay | Admitting: Pulmonary Disease

## 2024-01-21 MED ORDER — PREDNISONE 20 MG PO TABS
20.0000 mg | ORAL_TABLET | Freq: Every day | ORAL | 0 refills | Status: AC
  Filled 2024-01-21: qty 5, 5d supply, fill #0

## 2024-01-21 NOTE — Telephone Encounter (Signed)
 Ongoing URI symptoms, nasal and sinus congestion. Trial prednisone 20 mg daily x 5 days.

## 2024-01-26 ENCOUNTER — Other Ambulatory Visit (HOSPITAL_COMMUNITY): Payer: Self-pay

## 2024-02-04 ENCOUNTER — Ambulatory Visit: Admitting: Behavioral Health

## 2024-02-16 ENCOUNTER — Other Ambulatory Visit (HOSPITAL_COMMUNITY): Payer: Self-pay

## 2024-02-16 ENCOUNTER — Other Ambulatory Visit: Payer: Self-pay

## 2024-02-16 ENCOUNTER — Other Ambulatory Visit: Payer: Self-pay | Admitting: Physician Assistant

## 2024-02-16 MED ORDER — SPIRONOLACTONE 50 MG PO TABS
50.0000 mg | ORAL_TABLET | Freq: Two times a day (BID) | ORAL | 1 refills | Status: DC
  Filled 2024-02-16: qty 180, 90d supply, fill #0
  Filled 2024-05-13: qty 180, 90d supply, fill #1

## 2024-02-19 ENCOUNTER — Other Ambulatory Visit (HOSPITAL_COMMUNITY): Payer: Self-pay

## 2024-02-20 ENCOUNTER — Other Ambulatory Visit (HOSPITAL_COMMUNITY): Payer: Self-pay

## 2024-02-20 ENCOUNTER — Encounter (HOSPITAL_COMMUNITY): Payer: Self-pay

## 2024-02-25 ENCOUNTER — Other Ambulatory Visit: Payer: Self-pay

## 2024-02-28 ENCOUNTER — Other Ambulatory Visit (HOSPITAL_COMMUNITY): Payer: Self-pay

## 2024-03-18 ENCOUNTER — Other Ambulatory Visit: Payer: Self-pay

## 2024-04-04 ENCOUNTER — Other Ambulatory Visit: Payer: Self-pay | Admitting: Physician Assistant

## 2024-04-05 NOTE — Telephone Encounter (Signed)
 Pt requesting refill for Vyvanse  60 mg. Last OV 11/2023.

## 2024-04-06 ENCOUNTER — Encounter: Payer: Self-pay | Admitting: Physician Assistant

## 2024-04-06 ENCOUNTER — Encounter (HOSPITAL_COMMUNITY): Payer: Self-pay

## 2024-04-06 ENCOUNTER — Other Ambulatory Visit (HOSPITAL_COMMUNITY): Payer: Self-pay

## 2024-04-09 ENCOUNTER — Other Ambulatory Visit (HOSPITAL_COMMUNITY): Payer: Self-pay

## 2024-04-09 ENCOUNTER — Telehealth: Admitting: Physician Assistant

## 2024-04-09 ENCOUNTER — Encounter: Payer: Self-pay | Admitting: Physician Assistant

## 2024-04-09 VITALS — Ht 66.0 in | Wt 175.0 lb

## 2024-04-09 DIAGNOSIS — F50819 Binge eating disorder, unspecified: Secondary | ICD-10-CM | POA: Diagnosis not present

## 2024-04-09 MED ORDER — LISDEXAMFETAMINE DIMESYLATE 60 MG PO CAPS
60.0000 mg | ORAL_CAPSULE | ORAL | 0 refills | Status: DC
  Filled 2024-04-09: qty 30, 30d supply, fill #0

## 2024-04-09 MED ORDER — LISDEXAMFETAMINE DIMESYLATE 60 MG PO CAPS
60.0000 mg | ORAL_CAPSULE | ORAL | 0 refills | Status: DC
  Filled 2024-05-30: qty 30, 30d supply, fill #0

## 2024-04-09 MED ORDER — LISDEXAMFETAMINE DIMESYLATE 60 MG PO CAPS
60.0000 mg | ORAL_CAPSULE | ORAL | 0 refills | Status: AC
  Filled 2024-08-05: qty 30, 30d supply, fill #0

## 2024-04-09 NOTE — Progress Notes (Signed)
 Virtual Visit via Video Note   I, Jamie Holder, connected with  Jamie Holder Jamie Holder  ([id number], 1986/02/21) on 04/09/24 at 10:40 AM EDT by a video-enabled telemedicine application and verified that I am speaking with the correct person using two identifiers.  Location: Patient: Home Provider: [facility] [place] office   I discussed the limitations of evaluation and management by telemedicine and the availability of in person appointments. The patient expressed understanding and agreed to proceed.    History of Present Illness: Jamie Holder is a 38 y.o. who identifies as a female who was assigned female at birth, and is being seen today for Attention Deficit Hyperactivity Disorder (ADHD) follow up .  ADHD evaluation She is currently taking Vyvanse  60 mg daily Reports there is no palpitations, anxiety, insomnia Feels symptom(s) are well controlled on current dosage   Problems:  Patient Active Problem List   Diagnosis Date Noted   Acute cholecystitis 09/17/2022   Anxiety 11/13/2020   Elevated blood pressure reading 02/04/2020   Gastroesophageal reflux disease 02/04/2020   Obesity 02/04/2020    Allergies: No Known Allergies Medications:  Current Outpatient Medications:    cetirizine (ZYRTEC) 10 MG chewable tablet, Chew 10 mg by mouth daily., Disp: , Rfl:    FLUoxetine  (PROZAC ) 20 MG capsule, Take 1 capsule (20 mg total) by mouth daily., Disp: 90 capsule, Rfl: 3   FLUoxetine  (PROZAC ) 40 MG capsule, Take 1 capsule (40 mg total) by mouth daily., Disp: 90 capsule, Rfl: 3   lisdexamfetamine (VYVANSE ) 60 MG capsule, Take 1 capsule (60 mg total) by mouth every morning., Disp: 30 capsule, Rfl: 0   [START ON 05/09/2024] lisdexamfetamine (VYVANSE ) 60 MG capsule, Take 1 capsule (60 mg total) by mouth every morning., Disp: 30 capsule, Rfl: 0   [START ON 06/08/2024] lisdexamfetamine (VYVANSE ) 60 MG capsule, Take 1 capsule (60 mg total) by mouth every morning.,  Disp: 30 capsule, Rfl: 0   montelukast  (SINGULAIR ) 10 MG tablet, Take 1 tablet (10 mg total) by mouth at bedtime., Disp: 90 tablet, Rfl: 3   nitrofurantoin, macrocrystal-monohydrate, (MACROBID) 100 MG capsule, Take 100 mg by mouth 2 (two) times daily., Disp: , Rfl:    norethindrone  (MICRONOR ) 0.35 MG tablet, Take 1 tablet (0.35 mg total) by mouth daily., Disp: 84 tablet, Rfl: 3   omeprazole  (PRILOSEC) 20 MG capsule, Take 1 capsule (20 mg total) by mouth daily., Disp: 90 capsule, Rfl: 3   phenazopyridine (PYRIDIUM) 200 MG tablet, Take 200 mg by mouth 3 (three) times daily., Disp: , Rfl:    spironolactone  (ALDACTONE ) 50 MG tablet, Take 1 tablet (50 mg total) by mouth 2 (two) times daily., Disp: 180 tablet, Rfl: 1  Observations/Objective: Patient is well-developed, well-nourished in no acute distress.  Resting comfortably  at home.  Head is normocephalic, atraumatic.  No labored breathing.  Speech is clear and coherent with logical content.  Patient is alert and oriented at baseline.   Assessment and Plan: 1. Binge eating disorder, unspecified severity (Primary) No red flags on PDMP Refill Vyvanse  60 mg daily Follow up in 3 month(s), sooner if concerns   Follow Up Instructions: I discussed the assessment and treatment plan with the patient. The patient was provided an opportunity to ask questions and all were answered. The patient agreed with the plan and demonstrated an understanding of the instructions.  A copy of instructions were sent to the patient via MyChart unless otherwise noted below.   The patient was advised to call back  or seek an in-person evaluation if the symptoms worsen or if the condition fails to improve as anticipated.  Jamie Holder, [place]

## 2024-05-13 ENCOUNTER — Other Ambulatory Visit: Payer: Self-pay | Admitting: Physician Assistant

## 2024-05-14 ENCOUNTER — Other Ambulatory Visit (HOSPITAL_COMMUNITY): Payer: Self-pay

## 2024-05-14 ENCOUNTER — Other Ambulatory Visit: Payer: Self-pay

## 2024-05-14 MED ORDER — OMEPRAZOLE 20 MG PO CPDR
20.0000 mg | DELAYED_RELEASE_CAPSULE | Freq: Every day | ORAL | 3 refills | Status: AC
  Filled 2024-05-14: qty 90, 90d supply, fill #0
  Filled 2024-08-05: qty 90, 90d supply, fill #1

## 2024-05-15 ENCOUNTER — Other Ambulatory Visit (HOSPITAL_COMMUNITY): Payer: Self-pay

## 2024-05-26 ENCOUNTER — Other Ambulatory Visit (HOSPITAL_COMMUNITY)
Admission: RE | Admit: 2024-05-26 | Discharge: 2024-05-26 | Disposition: A | Discharge status: Home / Self Care | Source: Ambulatory Visit | Attending: Obstetrics and Gynecology | Admitting: Obstetrics and Gynecology

## 2024-05-26 ENCOUNTER — Other Ambulatory Visit (HOSPITAL_COMMUNITY): Payer: Self-pay

## 2024-05-26 ENCOUNTER — Ambulatory Visit (INDEPENDENT_AMBULATORY_CARE_PROVIDER_SITE_OTHER): Admitting: Obstetrics and Gynecology

## 2024-05-26 ENCOUNTER — Encounter: Payer: Self-pay | Admitting: Obstetrics and Gynecology

## 2024-05-26 VITALS — BP 141/92 | HR 96 | Ht 66.0 in | Wt 183.0 lb

## 2024-05-26 DIAGNOSIS — R61 Generalized hyperhidrosis: Secondary | ICD-10-CM | POA: Diagnosis not present

## 2024-05-26 DIAGNOSIS — Z01419 Encounter for gynecological examination (general) (routine) without abnormal findings: Secondary | ICD-10-CM | POA: Insufficient documentation

## 2024-05-26 DIAGNOSIS — Z3041 Encounter for surveillance of contraceptive pills: Secondary | ICD-10-CM

## 2024-05-26 MED ORDER — NORETHINDRONE 0.35 MG PO TABS
1.0000 | ORAL_TABLET | Freq: Every day | ORAL | 3 refills | Status: AC
  Filled 2024-05-26: qty 84, 84d supply, fill #0

## 2024-05-26 NOTE — Progress Notes (Signed)
 ANNUAL EXAM Patient name: Jamie Holder MRN [id number]  Date of birth: 1986/06/19 Chief Complaint:   Gynecologic Exam (Pt reported some menopause symptoms, would like to switch to Estrogen only pill, having some brain fog x few months, night sweats x 1 year.)  History of Present Illness:   Jamie Holder is a 38 y.o. [phone] female being seen today for a routine annual exam.   Current concerns:  Discussed the use of AI scribe software for clinical note transcription with the patient, who gave verbal consent to proceed.  She experiences night sweats that are new and disruptive to her sleep and reports brain fog. She does not usually have hot flashes during the day, although she occasionally feels a little warm.  She mentions some vaginal dryness, for which she uses a moisturizer that helps.  She is currently on the mini pill and does not have periods while on it, which she appreciates. Her blood pressure tends to run a little high, but not high enough to require treatment according to her primary care provider.  Previously, she reported night sweats and unwanted hair growth. She is on POP for [facility] and has been happy on it.  She reports mom went through menopause in her 55s. FSH was normal in 04/2023.   Current birth control: POP  No LMP recorded. (Menstrual status: Oral contraceptives).  Last Pap/Pap History: 08/2020. Results were: NILM w/ HRHPV negative. H/O abnormal pap: no  Review of Systems:   Pertinent items are noted in HPI Denies any headaches, blurred vision, fatigue, shortness of breath, chest pain, abdominal pain, abnormal vaginal discharge/itching/odor/irritation, problems with periods, bowel movements, urination, or intercourse unless otherwise stated above.  Pertinent History Reviewed:  Reviewed past medical,surgical, social and family history.  Reviewed problem list, medications and allergies. Physical Assessment:   Vitals:   05/26/24 1002  05/26/24 1005  BP: (!) 145/97 (!) 141/92  Pulse: 99 96  Weight: 183 lb (83 kg)   Height: 5' 6 (1.676 m)    Body mass index is 29.54 kg/m.   Physical Examination:  General appearance - well appearing, and in no distress Mental status - alert, oriented to person, place, and time Psych:  She has a normal mood and affect Skin - warm and dry, normal color, no suspicious lesions noted Chest - effort normal Heart - normal rate  Breasts - breasts appear normal, no suspicious masses, no skin or nipple changes or axillary nodes Abdomen - soft, nontender, nondistended, no masses or organomegaly Pelvic -  Performed and: VULVA: normal appearing vulva with no masses, tenderness or lesions VAGINA: normal appearing vagina with normal color and discharge, no lesions CERVIX: normal appearing cervix without discharge or lesions, no CMT. UTERUS: Not examined ADNEXA: Not examined Extremities:  No swelling or varicosities noted  Chaperone present for exam  No results found for this or any previous visit (from the past 24 hours).  Assessment & Plan:  [name] was seen today for gynecologic exam.  Diagnoses and all orders for this visit:  Encounter for annual routine gynecological examination - Cervical cancer screening: Discussed guidelines. Pap with HPV done - GC/CT: declines - Birth Control: POPs - Breast Health: Encouraged self breast awareness/SBE. Teaching provided.  - Discussed strength training. Discussed bone health, I.e. vit D and calcium.  - F/U 12 months and prn  Night sweats Experiencing night sweats, brain fog, vaginal dryness. Hormone therapy may help night sweats and brain fog. Vaginal dryness managed with moisturizer. -  Schedule IUD insertion for endometrial protection and birth control and bridge x3 months with POP - Continue using current moisturizer for vaginal dryness. - Initiate estrogen therapy after IUD insertion for night sweats and brain fog.  Encounter for  surveillance of contraceptive pills -     norethindrone  (MICRONOR ) 0.35 MG tablet; Take 1 tablet (0.35 mg total) by mouth daily.  No orders of the defined types were placed in this encounter.   Meds:  No orders of the defined types were placed in this encounter.   Follow-up: Return for Follow up - IUD and HT.  Vina Solian, MD 05/26/2024 10:38 AM

## 2024-05-28 LAB — CYTOLOGY - PAP
Comment: NEGATIVE
Diagnosis: NEGATIVE
High risk HPV: NEGATIVE

## 2024-05-31 ENCOUNTER — Ambulatory Visit: Payer: Self-pay | Admitting: Obstetrics and Gynecology

## 2024-05-31 ENCOUNTER — Other Ambulatory Visit (HOSPITAL_COMMUNITY): Payer: Self-pay

## 2024-06-01 ENCOUNTER — Other Ambulatory Visit (HOSPITAL_COMMUNITY): Payer: Self-pay

## 2024-06-01 ENCOUNTER — Ambulatory Visit (INDEPENDENT_AMBULATORY_CARE_PROVIDER_SITE_OTHER): Admitting: Physician Assistant

## 2024-06-01 ENCOUNTER — Encounter: Payer: Self-pay | Admitting: Physician Assistant

## 2024-06-01 VITALS — BP 124/80 | HR 112 | Temp 97.9°F | Ht 66.0 in | Wt 182.5 lb

## 2024-06-01 DIAGNOSIS — Z23 Encounter for immunization: Secondary | ICD-10-CM

## 2024-06-01 DIAGNOSIS — E663 Overweight: Secondary | ICD-10-CM | POA: Diagnosis not present

## 2024-06-01 DIAGNOSIS — F418 Other specified anxiety disorders: Secondary | ICD-10-CM | POA: Diagnosis not present

## 2024-06-01 DIAGNOSIS — Z13220 Encounter for screening for lipoid disorders: Secondary | ICD-10-CM | POA: Diagnosis not present

## 2024-06-01 DIAGNOSIS — Z0000 Encounter for general adult medical examination without abnormal findings: Secondary | ICD-10-CM | POA: Diagnosis not present

## 2024-06-01 LAB — COMPREHENSIVE METABOLIC PANEL WITH GFR
ALT: 12 U/L (ref 0–35)
AST: 11 U/L (ref 0–37)
Albumin: 4.3 g/dL (ref 3.5–5.2)
Alkaline Phosphatase: 60 U/L (ref 39–117)
BUN: 11 mg/dL (ref 6–23)
CO2: 24 meq/L (ref 19–32)
Calcium: 9.8 mg/dL (ref 8.4–10.5)
Chloride: 101 meq/L (ref 96–112)
Creatinine, Ser: 0.85 mg/dL (ref 0.40–1.20)
GFR: 86.04 mL/min (ref >60.00)
Glucose, Bld: 94 mg/dL (ref 70–99)
Potassium: 4.4 meq/L (ref 3.5–5.1)
Sodium: 136 meq/L (ref 135–145)
Total Bilirubin: 0.3 mg/dL (ref 0.2–1.2)
Total Protein: 7.6 g/dL (ref 6.0–8.3)

## 2024-06-01 LAB — CBC WITH DIFFERENTIAL/PLATELET
Basophils Absolute: 0 K/uL (ref 0.0–0.1)
Basophils Relative: 0.4 % (ref 0.0–3.0)
Eosinophils Absolute: 0.1 K/uL (ref 0.0–0.7)
Eosinophils Relative: 0.8 % (ref 0.0–5.0)
HCT: 39.9 % (ref 36.0–46.0)
Hemoglobin: 13.2 g/dL (ref 12.0–15.0)
Lymphocytes Relative: 24.8 % (ref 12.0–46.0)
Lymphs Abs: 2.1 K/uL (ref 0.7–4.0)
MCHC: 33.1 g/dL (ref 30.0–36.0)
MCV: 85 fl (ref 78.0–100.0)
Monocytes Absolute: 0.5 K/uL (ref 0.1–1.0)
Monocytes Relative: 5.3 % (ref 3.0–12.0)
Neutro Abs: 5.9 K/uL (ref 1.4–7.7)
Neutrophils Relative %: 68.7 % (ref 43.0–77.0)
Platelets: 367 K/uL (ref 150.0–400.0)
RBC: 4.69 Mil/uL (ref 3.87–5.11)
RDW: 13.5 % (ref 11.5–15.5)
WBC: 8.6 K/uL (ref 4.0–10.5)

## 2024-06-01 LAB — LIPID PANEL
Cholesterol: 247 mg/dL — ABNORMAL HIGH (ref 0–200)
HDL: 61.7 mg/dL (ref >39.00)
LDL Cholesterol: 135 mg/dL — ABNORMAL HIGH (ref 0–99)
NonHDL: 185.26
Total CHOL/HDL Ratio: 4
Triglycerides: 250 mg/dL — ABNORMAL HIGH (ref 0.0–149.0)
VLDL: 50 mg/dL — ABNORMAL HIGH (ref 0.0–40.0)

## 2024-06-01 MED ORDER — LISDEXAMFETAMINE DIMESYLATE 60 MG PO CAPS
60.0000 mg | ORAL_CAPSULE | ORAL | 0 refills | Status: AC
  Filled 2024-07-05: qty 30, 30d supply, fill #0

## 2024-06-01 MED ORDER — LISDEXAMFETAMINE DIMESYLATE 60 MG PO CAPS: 60.0000 mg | ORAL_CAPSULE | ORAL | 0 refills | Status: AC

## 2024-06-01 MED ORDER — LISDEXAMFETAMINE DIMESYLATE 60 MG PO CAPS
60.0000 mg | ORAL_CAPSULE | ORAL | 0 refills | Status: AC
  Filled 2024-06-01: qty 30, 30d supply, fill #0

## 2024-06-01 NOTE — Progress Notes (Signed)
 Subjective:    Jamie Holder is a 38 y.o. female and is here for a comprehensive physical exam.  HPI  There are no preventive care reminders to display for this patient.  Discussed the use of AI scribe software for clinical note transcription with the patient, who gave verbal consent to proceed.  History of Present Illness   Jamie Holder is a 38 year old female who presents with menopausal symptoms.  She experiences night sweats and brain fog, which have worsened over the past few months. She has reduced alcohol consumption to about one drink per week and is trying to exercise more, aiming for three to four classes a week. Her diet now includes more fruits and vegetables. Sleep has improved with reduced alcohol intake, although her six-year-old son still wakes up at night.  She previously used compounded GLP-1 but stopped due to nausea. She currently takes Vyvanse  for appetite control and Prozac , with both dosages being satisfactory. She takes Singulair  seasonally and is trying to reduce omeprazole  intake to every other day.  She has no chest pain, palpitations, unexplained headaches, joint pain, tremors, swelling, diarrhea, constipation, or gestational diabetes.       Health Maintenance: Immunizations -- UpToDate, received flu shot Colonoscopy -- n/a Mammogram -- ordered, not scheduled PAP -- UpToDate  Bone Density -- N/A  Diet -- overall healthy diet Exercise -- goes to [employer] classes regularly  Sleep habits -- improving Mood -- overall stable  UTD with dentist? - yes UTD with eye doctor? - yes  Weight history: Wt Readings from Last 10 Encounters:  06/01/24 182 lb 8 oz (82.8 kg)  05/26/24 183 lb (83 kg)  04/09/24 175 lb (79.4 kg)  12/11/23 178 lb (80.7 kg)  08/11/23 185 lb (83.9 kg)  05/19/23 189 lb (85.7 kg)  05/12/23 189 lb 9.6 oz (86 kg)  09/17/22 170 lb (77.1 kg)  05/08/22 182 lb (82.6 kg)  02/04/22 197 lb 4 oz (89.5 kg)   Body mass  index is 29.46 kg/m. No LMP recorded. (Menstrual status: Oral contraceptives).  Alcohol use:  reports current alcohol use of about 1.0 standard drink of alcohol per week.  Tobacco use:  Tobacco Use: Low Risk  (06/01/2024)   Patient History    Smoking Tobacco Use: Never    Smokeless Tobacco Use: Never    Passive Exposure: Not on file   Eligible for lung cancer screening? no     06/01/2024    1:23 PM  Depression screen PHQ 2/9  Decreased Interest 0  Down, Depressed, Hopeless 0  PHQ - 2 Score 0  Altered sleeping 0  Tired, decreased energy 0  Change in appetite 0  Feeling bad or failure about yourself  0  Trouble concentrating 0  Moving slowly or fidgety/restless 0  Suicidal thoughts 0  PHQ-9 Score 0     Other providers/specialists: Patient Care Team: Job Lukes, [place] as PCP - General (Physician Assistant) Devota Browning, NP as Nurse Practitioner (Psychology) Corrine Slough, OD as Attending Physician (Optometry)    PMHx, SurgHx, SocialHx, Medications, and Allergies were reviewed in the Visit Navigator and updated as appropriate.   Past Medical History:  Diagnosis Date   Allergy    Seasonal   Anxiety    GERD (gastroesophageal reflux disease) 2016   PONV (postoperative nausea and vomiting)      Past Surgical History:  Procedure Laterality Date   ABDOMINAL SURGERY     BREAST REDUCTION SURGERY  04/28/2021  CHOLECYSTECTOMY N/A 09/17/2022   Procedure: LAPAROSCOPIC CHOLECYSTECTOMY;  Surgeon: Signe Mitzie LABOR, MD;  Location: [facility] OR;  Service: General;  Laterality: N/A;   CHOLECYSTECTOMY  09/17/2022   COSMETIC SURGERY  04/2021   Breast reduction and abdominalplasty   ESOPHAGOSCOPY W/ BOTOX INJECTION     WISDOM TOOTH EXTRACTION       Family History  Problem Relation Age of Onset   Depression Mother    Hearing loss Mother    Hyperlipidemia Mother    Hypertension Mother    Thyroid cancer Mother    Anxiety disorder Mother    ADD / ADHD Mother     Hyperlipidemia Father    Hypertension Father    Depression Sister    Anxiety disorder Sister    ADD / ADHD Sister    Heart attack Maternal Grandmother    Heart attack Paternal Grandmother    Breast cancer Neg Hx    Colon cancer Neg Hx    Osteoporosis Neg Hx     Social History   Tobacco Use   Smoking status: Never   Smokeless tobacco: Never  Vaping Use   Vaping status: Never Used  Substance Use Topics   Alcohol use: Yes    Alcohol/week: 1.0 standard drink of alcohol    Types: 1 Standard drinks or equivalent per week   Drug use: Never    Review of Systems:   Review of Systems  Constitutional:  Negative for chills, fever, malaise/fatigue and weight loss.  HENT:  Negative for hearing loss, sinus pain and sore throat.   Respiratory:  Negative for cough and hemoptysis.   Cardiovascular:  Negative for chest pain, palpitations, leg swelling and PND.  Gastrointestinal:  Negative for abdominal pain, constipation, diarrhea, heartburn, nausea and vomiting.  Genitourinary:  Negative for dysuria, frequency and urgency.  Musculoskeletal:  Negative for back pain, myalgias and neck pain.  Skin:  Negative for itching and rash.  Neurological:  Negative for dizziness, tingling, seizures and headaches.  Endo/Heme/Allergies:  Negative for polydipsia.  Psychiatric/Behavioral:  Negative for depression. The patient is not nervous/anxious.     Objective:   BP 124/80 (BP Location: Left Arm, Patient Position: Sitting, Cuff Size: Large)   Pulse (!) 112   Temp 97.9 F (36.6 C) (Temporal)   Ht 5' 6 (1.676 m)   Wt 182 lb 8 oz (82.8 kg)   SpO2 99%   BMI 29.46 kg/m  Body mass index is 29.46 kg/m.   General Appearance:    Alert, cooperative, no distress, appears stated age  Head:    Normocephalic, without obvious abnormality, atraumatic  Eyes:    PERRL, conjunctiva/corneas clear, EOM's intact, fundi    benign, both eyes  Ears:    Normal TM's and external ear canals, both ears  Nose:    Nares normal, septum midline, mucosa normal, no drainage    or sinus tenderness  Throat:   Lips, mucosa, and tongue normal; teeth and gums normal  Neck:   Supple, symmetrical, trachea midline, no adenopathy;    thyroid:  no enlargement/tenderness/nodules; no carotid   bruit or JVD  Back:     Symmetric, no curvature, ROM normal, no CVA tenderness  Lungs:     Clear to auscultation bilaterally, respirations unlabored  Chest Wall:    No tenderness or deformity   Heart:    Regular rate and rhythm, S1 and S2 normal, no murmur, rub or gallop  Breast Exam:    Deferred  Abdomen:     Soft,  non-tender, bowel sounds active all four quadrants,    no masses, no organomegaly  Genitalia:    Deferred   Extremities:   Extremities normal, atraumatic, no cyanosis or edema  Pulses:   2+ and symmetric all extremities  Skin:   Skin color, texture, turgor normal, no rashes or lesions  Lymph nodes:   Cervical, supraclavicular, and axillary nodes normal  Neurologic:   CNII-XII intact, normal strength, sensation and reflexes    throughout    Assessment/Plan:   Assessment and Plan    Adult Wellness Visit Routine wellness visit. Alcohol reduced to one drink per week. Regular exercise and dietary improvements ongoing. Mammogram ordered, not scheduled. No colonoscopy needed until age 66. Up to date with dental and eye exams. - Ordered blood work. - Ensure mammogram is scheduled.  Attention-deficit hyperactivity disorder (ADHD), adult ADHD managed with Vyvanse . No dosing concerns. Vyvanse  aids appetite control. Telehealth visits covered by insurance. - Refilled Vyvanse  prescription. - PDMP reviewed during this encounter.  Depression w anxiety Managed with Prozac . Current dosage effective. - Continue current Prozac  dosage.  Immunization management Flu shot administered. - Continue routine immunization schedule.      [name], PA-C  [facility]

## 2024-06-02 ENCOUNTER — Ambulatory Visit: Payer: Self-pay | Admitting: Physician Assistant

## 2024-06-02 ENCOUNTER — Other Ambulatory Visit (HOSPITAL_COMMUNITY): Payer: Self-pay

## 2024-06-11 ENCOUNTER — Other Ambulatory Visit (HOSPITAL_COMMUNITY): Payer: Self-pay

## 2024-06-17 ENCOUNTER — Ambulatory Visit: Admitting: Obstetrics and Gynecology

## 2024-07-05 ENCOUNTER — Other Ambulatory Visit: Payer: Self-pay | Admitting: Obstetrics and Gynecology

## 2024-07-05 ENCOUNTER — Other Ambulatory Visit (HOSPITAL_COMMUNITY): Payer: Self-pay

## 2024-07-05 DIAGNOSIS — Z1231 Encounter for screening mammogram for malignant neoplasm of breast: Secondary | ICD-10-CM

## 2024-07-06 NOTE — Progress Notes (Unsigned)
 GYNECOLOGY OFFICE VISIT NOTE  History:  Jamie Holder is a 38 y.o. [id number] here today for IUD placement for  birth control and then initiation of HT for VMS. She has been on POP for birth control.    Past Medical History:  Diagnosis Date   Allergy    Seasonal   Anxiety    GERD (gastroesophageal reflux disease) 2016   PONV (postoperative nausea and vomiting)     Past Surgical History:  Procedure Laterality Date   ABDOMINAL SURGERY     BREAST REDUCTION SURGERY  04/28/2021   CHOLECYSTECTOMY N/A 09/17/2022   Procedure: LAPAROSCOPIC CHOLECYSTECTOMY;  Surgeon: Signe Mitzie LABOR, MD;  Location: [facility] OR;  Service: General;  Laterality: N/A;   CHOLECYSTECTOMY  09/17/2022   COSMETIC SURGERY  04/2021   Breast reduction and abdominalplasty   ESOPHAGOSCOPY W/ BOTOX INJECTION     WISDOM TOOTH EXTRACTION      The following portions of the patient's history were reviewed and updated as appropriate: allergies, current medications, past family history, past medical history, past social history, past surgical history and problem list.   Health Maintenance:   Normal pap and negative HRHPV:   Diagnosis  Date Value Ref Range Status  05/26/2024   Final   - Negative for intraepithelial lesion or malignancy (NILM)   Review of Systems:  Pertinent items noted in HPI and remainder of comprehensive ROS otherwise negative.  Physical Exam:  There were no vitals taken for this visit. CONSTITUTIONAL: Well-developed, well-nourished female in no acute distress.  HEENT:  Normocephalic, atraumatic. External right and left ear normal. No scleral icterus.  NECK: Normal range of motion, supple, no masses noted on observation SKIN: No rash noted. Not diaphoretic. No erythema. No pallor. MUSCULOSKELETAL: Normal range of motion. No edema noted. NEUROLOGIC: Alert and oriented to person, place, and time. Normal muscle tone coordination. No cranial nerve deficit noted. PSYCHIATRIC: Normal mood and  affect. Normal behavior. Normal judgment and thought content.  PELVIC: {Blank single:19197::Deferred,Normal appearing external genitalia; normal urethral meatus; normal appearing vaginal mucosa and cervix.  No abnormal discharge noted.  Normal uterine size, no other palpable masses, no uterine or adnexal tenderness. Performed in the presence of a chaperone}   GYNECOLOGY OFFICE PROCEDURE NOTE  IUD Insertion Procedure Note Procedure: IUD insertion with Mirena UPT: {Blank single:19197::Negative} GC/CT testing: Not indicated  Patient identified.  Risks, benefits and alternatives of procedure were discussed including irregular bleeding, cramping, infection, malpositioning or misplacement of the IUD outside the uterus which may require further procedure such as laparoscopy. Also discussed >99% contraception efficacy, increased risk of ectopic pregnancy with failure of method.   Emphasized that this did not protect against STIs, condoms recommended during all sexual encounters. Consent signed. Time out performed.   Speculum inserted and cervix visualized, prepped with 3 swabs of betadine. Intracervical block was done with 1% lidocaine  at 12/3/6/9 for a total of *** cc.   {pad iud steps:34088::IUD then inserted without difficulty per manufacturer's instructions and strings cut to 3 cm below cervical os and all instruments removed. Pt tolerated well with minimal pain and bleeding.}  Assessment and Plan:  1. Encounter for IUD insertion (Primary) - IUD placed  2. Night sweats - We discussed the treatment options for menopause as well as indications, both HT and non-HT (Veozah, Paroxetine, Venlafaxine, Gabapentin) - Discussed the benefits of each and relative effectiveness.  - Discussed goals of therapy i.e. reduction of hot flashes (not complete resolution). Reviewed full response takes up to  2-3 months, including for hormone therapy. - Discussed if HT we do shortest amount of time at lowest  dose. We discussed annual attempts at coming of the HT typically in the fall months when the weather has cooled down. - We discussed the differences in modes of therapy for HT -- patch vs oral therapy.  - Discussed risks of HT: E+P = breast cancer, clotting, MI/Stroke. Discussed risk of E alone. We reviewed that limits of data from the [facility] regarding breast cancer impact - only progesterone used in that study was provera which is biologically active in the breast. We MAY reduce that risk by doing a different progesterone based therapy I.e. norethindrone , prometrium. We reviewed the indication and necessity for progesterone and that estrogen alone in those with a uterus to prevent risk of endometrial cancer (due to unopposed estrogen) - Discussed different possible regimens - Discussed genitourinary symptoms i.e. urinary issues, vaginal dryness and dyspareunia and that for these symptoms, local treatment is best. - She would like: {Blank single:19197::non-HT options of ***,HT,Expectant management/lifestyle modification}   Diagnoses and all orders for this visit:  Encounter for IUD insertion  Night sweats     No orders of the defined types were placed in this encounter.    Routine preventative health maintenance measures emphasized. Please refer to After Visit Summary for other counseling recommendations.   No follow-ups on file.  Vina Solian, MD, FACOG Obstetrician & Gynecologist, [facility], [facility]

## 2024-07-07 ENCOUNTER — Other Ambulatory Visit (HOSPITAL_COMMUNITY): Payer: Self-pay

## 2024-07-07 ENCOUNTER — Ambulatory Visit: Admitting: Obstetrics and Gynecology

## 2024-07-07 ENCOUNTER — Encounter: Payer: Self-pay | Admitting: Obstetrics and Gynecology

## 2024-07-07 ENCOUNTER — Other Ambulatory Visit: Payer: Self-pay

## 2024-07-07 VITALS — BP 144/93 | HR 84 | Ht 66.0 in | Wt 180.0 lb

## 2024-07-07 DIAGNOSIS — Z30430 Encounter for insertion of intrauterine contraceptive device: Secondary | ICD-10-CM

## 2024-07-07 DIAGNOSIS — R61 Generalized hyperhidrosis: Secondary | ICD-10-CM

## 2024-07-07 DIAGNOSIS — Z3202 Encounter for pregnancy test, result negative: Secondary | ICD-10-CM

## 2024-07-07 LAB — POCT URINE PREGNANCY: Preg Test, Ur: NEGATIVE

## 2024-07-07 MED ORDER — VIVELLE-DOT 0.025 MG/24HR TD PTTW
1.0000 | MEDICATED_PATCH | TRANSDERMAL | 12 refills | Status: AC
  Filled 2024-07-07 – 2024-07-21 (×3): qty 8, 28d supply, fill #0
  Filled 2024-08-21: qty 24, 84d supply, fill #1

## 2024-07-07 MED ORDER — LEVONORGESTREL 20 MCG/DAY IU IUD
1.0000 | INTRAUTERINE_SYSTEM | Freq: Once | INTRAUTERINE | Status: AC
  Administered 2024-07-07: 1 via INTRAUTERINE

## 2024-07-08 ENCOUNTER — Other Ambulatory Visit (HOSPITAL_COMMUNITY): Payer: Self-pay

## 2024-07-17 ENCOUNTER — Other Ambulatory Visit (HOSPITAL_COMMUNITY): Payer: Self-pay

## 2024-07-19 ENCOUNTER — Other Ambulatory Visit (HOSPITAL_COMMUNITY): Payer: Self-pay

## 2024-07-21 ENCOUNTER — Encounter (HOSPITAL_COMMUNITY): Payer: Self-pay

## 2024-07-21 ENCOUNTER — Other Ambulatory Visit: Payer: Self-pay

## 2024-07-21 ENCOUNTER — Other Ambulatory Visit (HOSPITAL_COMMUNITY): Payer: Self-pay

## 2024-07-26 ENCOUNTER — Other Ambulatory Visit: Payer: Self-pay

## 2024-08-02 ENCOUNTER — Other Ambulatory Visit (HOSPITAL_COMMUNITY): Payer: Self-pay

## 2024-08-04 ENCOUNTER — Ambulatory Visit
Admission: RE | Admit: 2024-08-04 | Discharge: 2024-08-04 | Disposition: A | Discharge status: Home / Self Care | Source: Ambulatory Visit

## 2024-08-04 DIAGNOSIS — Z1231 Encounter for screening mammogram for malignant neoplasm of breast: Secondary | ICD-10-CM

## 2024-08-05 ENCOUNTER — Other Ambulatory Visit (HOSPITAL_COMMUNITY): Payer: Self-pay

## 2024-08-05 ENCOUNTER — Ambulatory Visit: Payer: Self-pay | Admitting: Obstetrics and Gynecology

## 2024-08-05 ENCOUNTER — Other Ambulatory Visit: Payer: Self-pay

## 2024-08-05 ENCOUNTER — Other Ambulatory Visit: Payer: Self-pay | Admitting: Physician Assistant

## 2024-08-05 MED ORDER — FLUOXETINE HCL 40 MG PO CAPS
40.0000 mg | ORAL_CAPSULE | Freq: Every day | ORAL | 3 refills | Status: AC
  Filled 2024-08-05: qty 90, 90d supply, fill #0

## 2024-08-07 ENCOUNTER — Other Ambulatory Visit (HOSPITAL_COMMUNITY): Payer: Self-pay

## 2024-08-13 ENCOUNTER — Other Ambulatory Visit (HOSPITAL_COMMUNITY): Payer: Self-pay

## 2024-08-21 ENCOUNTER — Other Ambulatory Visit: Payer: Self-pay | Admitting: Physician Assistant

## 2024-08-22 ENCOUNTER — Other Ambulatory Visit: Payer: Self-pay

## 2024-08-23 ENCOUNTER — Other Ambulatory Visit (HOSPITAL_COMMUNITY): Payer: Self-pay

## 2024-08-23 ENCOUNTER — Other Ambulatory Visit: Payer: Self-pay

## 2024-08-23 MED ORDER — SPIRONOLACTONE 50 MG PO TABS
50.0000 mg | ORAL_TABLET | Freq: Two times a day (BID) | ORAL | 1 refills | Status: AC
  Filled 2024-08-23: qty 180, 90d supply, fill #0

## 2024-08-25 ENCOUNTER — Other Ambulatory Visit (HOSPITAL_COMMUNITY): Payer: Self-pay

## 2024-08-25 ENCOUNTER — Other Ambulatory Visit: Payer: Self-pay

## 2024-08-31 ENCOUNTER — Encounter (HOSPITAL_COMMUNITY): Payer: Self-pay

## 2024-09-02 ENCOUNTER — Other Ambulatory Visit: Payer: Self-pay
